# Patient Record
Sex: Male | Born: 1950
Health system: Southern US, Community
[De-identification: ages and names within clinical notes are randomized; demographics above are authoritative.]

## PROBLEM LIST (undated history)

## (undated) DIAGNOSIS — N182 Chronic kidney disease, stage 2 (mild): Secondary | ICD-10-CM

## (undated) DIAGNOSIS — K219 Gastro-esophageal reflux disease without esophagitis: Secondary | ICD-10-CM

## (undated) DIAGNOSIS — T8859XA Other complications of anesthesia, initial encounter: Secondary | ICD-10-CM

## (undated) DIAGNOSIS — R7303 Prediabetes: Secondary | ICD-10-CM

## (undated) DIAGNOSIS — Z79899 Other long term (current) drug therapy: Secondary | ICD-10-CM

## (undated) DIAGNOSIS — I10 Essential (primary) hypertension: Secondary | ICD-10-CM

## (undated) DIAGNOSIS — M1712 Unilateral primary osteoarthritis, left knee: Secondary | ICD-10-CM

## (undated) DIAGNOSIS — E785 Hyperlipidemia, unspecified: Secondary | ICD-10-CM

## (undated) DIAGNOSIS — Z973 Presence of spectacles and contact lenses: Secondary | ICD-10-CM

## (undated) DIAGNOSIS — N400 Enlarged prostate without lower urinary tract symptoms: Secondary | ICD-10-CM

## (undated) HISTORY — DX: Prediabetes: R73.03

## (undated) HISTORY — DX: Hyperlipidemia, unspecified: E78.5

## (undated) HISTORY — DX: Chronic kidney disease, stage 2 (mild): N18.2

## (undated) HISTORY — PX: COLONOSCOPY: SHX174

## (undated) HISTORY — DX: Other long term (current) drug therapy: Z79.899

---

## 1988-10-24 HISTORY — PX: KNEE ARTHROSCOPY: SHX127

## 1990-10-24 HISTORY — PX: INGUINAL HERNIA REPAIR: SUR1180

## 2001-10-24 HISTORY — PX: CARPAL TUNNEL RELEASE: SHX101

## 2002-04-01 ENCOUNTER — Encounter: Admission: RE | Admit: 2002-04-01 | Discharge: 2002-04-01 | Payer: Self-pay | Admitting: Gastroenterology

## 2002-04-01 ENCOUNTER — Encounter: Payer: Self-pay | Admitting: Gastroenterology

## 2002-04-18 ENCOUNTER — Ambulatory Visit (HOSPITAL_COMMUNITY): Admission: RE | Admit: 2002-04-18 | Discharge: 2002-04-18 | Payer: Self-pay | Admitting: Gastroenterology

## 2004-02-20 ENCOUNTER — Ambulatory Visit (HOSPITAL_COMMUNITY): Admission: RE | Admit: 2004-02-20 | Discharge: 2004-02-20 | Payer: Self-pay | Admitting: Gastroenterology

## 2007-12-08 ENCOUNTER — Emergency Department (HOSPITAL_COMMUNITY): Admission: EM | Admit: 2007-12-08 | Discharge: 2007-12-08 | Payer: Self-pay | Admitting: Emergency Medicine

## 2008-07-24 ENCOUNTER — Emergency Department (HOSPITAL_COMMUNITY): Admission: EM | Admit: 2008-07-24 | Discharge: 2008-07-24 | Payer: Self-pay | Admitting: Emergency Medicine

## 2009-04-18 ENCOUNTER — Emergency Department (HOSPITAL_COMMUNITY): Admission: EM | Admit: 2009-04-18 | Discharge: 2009-04-18 | Payer: Self-pay | Admitting: Emergency Medicine

## 2009-04-29 ENCOUNTER — Encounter: Admission: RE | Admit: 2009-04-29 | Discharge: 2009-04-29 | Payer: Self-pay | Admitting: Orthopedic Surgery

## 2011-01-31 LAB — BASIC METABOLIC PANEL
BUN: 11 mg/dL (ref 6–23)
CO2: 32 mEq/L (ref 19–32)
Calcium: 9.1 mg/dL (ref 8.4–10.5)
Chloride: 104 mEq/L (ref 96–112)
Creatinine, Ser: 1.14 mg/dL (ref 0.4–1.5)
GFR calc Af Amer: >60 mL/min (ref >60)
GFR calc non Af Amer: >60 mL/min (ref >60)
Glucose, Bld: 100 mg/dL — ABNORMAL HIGH (ref 70–99)
Potassium: 3.3 mEq/L — ABNORMAL LOW (ref 3.5–5.1)
Sodium: 141 mEq/L (ref 135–145)

## 2011-01-31 LAB — DIFFERENTIAL
Basophils Absolute: 0 10*3/uL (ref 0.0–0.1)
Eosinophils Absolute: 0.2 10*3/uL (ref 0.0–0.7)
Eosinophils Relative: 6 % — ABNORMAL HIGH (ref 0–5)
Lymphocytes Relative: 38 % (ref 12–46)
Monocytes Absolute: 0.5 10*3/uL (ref 0.1–1.0)

## 2011-01-31 LAB — CBC
HCT: 43.9 % (ref 39.0–52.0)
Hemoglobin: 14.8 g/dL (ref 13.0–17.0)
MCHC: 33.8 g/dL (ref 30.0–36.0)
MCV: 89.7 fL (ref 78.0–100.0)
RDW: 13.6 % (ref 11.5–15.5)

## 2011-03-11 NOTE — Op Note (Signed)
NAME:  HASTEN, SWEITZER                         ACCOUNT NO.:  [id number]   MEDICAL RECORD NO.:  [id number]                   PATIENT TYPE:  AMB   LOCATION:  ENDO                                 FACILITY:  [facility]   PHYSICIAN:  Anselmo Rod, M.D.               DATE OF BIRTH:  01/06/1951   DATE OF PROCEDURE:  02/20/2004  DATE OF DISCHARGE:                                 OPERATIVE REPORT   PROCEDURE PERFORMED:  Screening colonoscopy.   ENDOSCOPIST:  Anselmo Rod, M.D.   INSTRUMENT USED:  Olympus video colonoscope.   INDICATION FOR PROCEDURE:  A 60 year old African-American male undergoing a  screening colonoscopy for rectal bleeding.  Rule out colonic polyps, masses,  etc.   PREPROCEDURE PREPARATION:  Informed consent was procured from the patient.  The patient was fasted for eight hours prior to the procedure and prepped  with a bottle of magnesium citrate and a gallon of GoLYTELY the night prior  to the procedure.   PREPROCEDURE PHYSICAL:  VITAL SIGNS:  The patient had stable vital signs.  NECK:  Supple.  CHEST:  Clear to auscultation.  S1, S2 regular.  ABDOMEN:  Soft with normal bowel sounds.   DESCRIPTION OF PROCEDURE:  The patient was placed in the left lateral  decubitus position and sedated with 50 mg of Demerol and 5 mg of Versed  intravenously.  Once the patient was adequately sedate and maintained on low-  flow oxygen and continuous cardiac monitoring, the Olympus video colonoscope  was advanced from the rectum to the cecum.  The appendiceal orifice and the  ileocecal valve were clearly visualized and photographed.  No masses or  polyps were identified.  There was some residual stool in the colon.  Multiple washes were done.  There was no evidence of diverticulosis.  Small  internal hemorrhoids were seen on retroflexion.  I suspect these are the  patient's source of rectal bleeding.  No other source of bleeding could be  identified.  The patient tolerated the  procedure well without immediate  complications.   IMPRESSION:  Normal colonoscopy up to the cecum except for small internal  hemorrhoids.   RECOMMENDATIONS:  1. Repeat CRC screening is recommended in the next 10 years unless the     patient develops any abnormal symptoms in the interim.  2. Continue a high-fiber diet with liberal fluid intake.  3. Outpatient follow-up if rectal bleeding recurs.  Steroid suppositories     versus surgical evaluation will be considered at that time.                                               Anselmo Rod, M.D.    JNM/MEDQ  D:  02/20/2004  T:  02/20/2004  Job:  [id number]   cc:  Olene Craven, M.D.  [address]  [address]  [place]  [place] [address]  Fax: [phone]

## 2011-03-11 NOTE — Procedures (Signed)
[place]  Patient:    Francisco Simmons, Francisco Simmons. Visit Number: [id number] MRN: [id number]          Service Type: Attending:  Anselmo Rod, M.D. Dictated by:   Anselmo Rod, M.D. Proc. Date: 04/18/02   CC:         Kern Reap, M.D.   Procedure Report  DATE OF BIRTH:  [birth year]  PROCEDURE PERFORMED:  Esophagogastroduodenoscopy with Savary dilatation of distal esophageal narrowing.  ENDOSCOPIST:  Anselmo Rod, M.D.  INSTRUMENT USED:  Olympus video panendoscope.  INDICATIONS FOR PROCEDURE:  A 60 year old African American male with a history of dysphagia for solids, rule out esophageal stricture.  The patient had a barium swallow that showed no evidence of a definite stricture, but slight narrowing of the distal esophagus.  PREPROCEDURE PREPARATION:  Informed consent was procured from the patient. The patient was fasted for eight hours prior to the procedure.  PREPROCEDURE PHYSICAL:  VITAL SIGNS:  Stable.  NECK:  Supple.  CHEST:  Clear to auscultation, S1 and S2 regular.  ABDOMEN:  Soft with normal bowel sounds.  DESCRIPTION OF PROCEDURE:  The patient was placed in the left lateral decubitus position and sedated with 50 mg of Demerol and 6 mg of Versed intravenously.  Once the patient was adequate sedated, maintained on low flow oxygen and continuous cardiac monitoring, the Olympus video panendoscope was advanced through the mouth piece over the tongue into the esophagus under direct vision.  The esophagus appeared widely patent and without lesions, and advancing the scope in the stomach.  There was a small hiatal hernia seen on high retroflexion.  Mild antral gastritis was noted.  The proximal small bowel appeared normal.  As the patient had had recent episodes of dysphagia with chicken and meats, a guidewire was passed into the stomach, and the esophagus was dilated with size 14 and size 16 Savary dilators.  There was no  evidence of heme on the dilators when they were pulled back.  Repeat EGD was done.  The GE junction is widely patent.  IMPRESSION: 1. Distal esophagus is dilated with Savary dilators. 2. No definite stricture seen. 3. Antral gastritis. 4. Small hiatal hernia.  RECOMMENDATIONS: 1. Start Protonix 40 mg one p.o. q.d. 2. Outpatient follow up in the next two weeks. 3. Chew foods well and supplement meals with liberal fluids. Dictated by:   Anselmo Rod, M.D. Attending:  Anselmo Rod, M.D. DD:  04/18/02 TD:  04/20/02 Job: [id number]

## 2011-07-15 LAB — BASIC METABOLIC PANEL
BUN: 11
GFR calc non Af Amer: >60
Glucose, Bld: 138 — ABNORMAL HIGH
Potassium: 3.4 — ABNORMAL LOW

## 2011-07-15 LAB — DIFFERENTIAL
Basophils Absolute: 0
Eosinophils Absolute: 0
Eosinophils Relative: 0
Lymphocytes Relative: 9 — ABNORMAL LOW

## 2011-07-15 LAB — CBC
HCT: 44.1
MCV: 87.6
Platelets: 253
RDW: 13.5

## 2011-07-15 LAB — POCT CARDIAC MARKERS
CKMB, poc: 1.8
Myoglobin, poc: 99.1

## 2011-07-15 LAB — RAPID URINE DRUG SCREEN, HOSP PERFORMED: Barbiturates: NOT DETECTED

## 2014-04-11 ENCOUNTER — Other Ambulatory Visit: Payer: Self-pay | Admitting: Orthopedic Surgery

## 2014-04-14 ENCOUNTER — Encounter (HOSPITAL_BASED_OUTPATIENT_CLINIC_OR_DEPARTMENT_OTHER)
Admission: RE | Admit: 2014-04-14 | Discharge: 2014-04-14 | Disposition: A | Discharge status: Home / Self Care | Payer: Worker's Compensation | Source: Ambulatory Visit | Attending: Orthopedic Surgery | Admitting: Orthopedic Surgery

## 2014-04-14 ENCOUNTER — Encounter (HOSPITAL_BASED_OUTPATIENT_CLINIC_OR_DEPARTMENT_OTHER): Payer: Self-pay | Admitting: *Deleted

## 2014-04-14 DIAGNOSIS — Z01810 Encounter for preprocedural cardiovascular examination: Secondary | ICD-10-CM | POA: Insufficient documentation

## 2014-04-14 LAB — BASIC METABOLIC PANEL
BUN: 14 mg/dL (ref 6–23)
CALCIUM: 8.5 mg/dL (ref 8.4–10.5)
CHLORIDE: 103 meq/L (ref 96–112)
CO2: 25 meq/L (ref 19–32)
CREATININE: 1.3 mg/dL (ref 0.50–1.35)
GFR calc Af Amer: 66 mL/min — ABNORMAL LOW (ref >90)
GFR calc non Af Amer: 57 mL/min — ABNORMAL LOW (ref >90)
GLUCOSE: 94 mg/dL (ref 70–99)
Potassium: 3.8 mEq/L (ref 3.7–5.3)
Sodium: 140 mEq/L (ref 137–147)

## 2014-04-14 NOTE — Progress Notes (Signed)
willlcome in for bmet-ekg

## 2014-04-14 NOTE — Progress Notes (Signed)
EKG reviewed by Dr Hurman Hornssesy. Please obtain records from primary/cardiology Dr Sharyn LullHarwani. Office notified by phone call and fax request.

## 2014-04-14 NOTE — Progress Notes (Signed)
04/14/14 1031  OBSTRUCTIVE SLEEP APNEA  Have you ever been diagnosed with sleep apnea through a sleep study? No  Do you snore loudly (loud enough to be heard through closed doors)?  0  Do you often feel tired, fatigued, or sleepy during the daytime? 0  Has anyone observed you stop breathing during your sleep? 0  Do you have, or are you being treated for high blood pressure? 1  BMI more than 35 kg/m2? 0  Age over 64 years old? 1  Neck circumference greater than 40 cm/16 inches? 1  Gender: 1  Obstructive Sleep Apnea Score 4  Score 4 or greater  Results sent to PCP

## 2014-04-14 NOTE — H&P (Signed)
Francisco Simmons. is an 63 y.o. male.   Chief Complaint: chronic left hand numbness HPI: 63 year old male with chronic left hand numbness.  Positive NCV by Dr Catalina Gravel on 03/21/2014  Past Medical History  Diagnosis Date  . Hypertension   . Wears glasses   . BPH (benign prostatic hypertrophy)     Past Surgical History  Procedure Laterality Date  . Carpal tunnel release  2003    right  . Inguinal hernia repair  1992    left  . Knee arthroscopy  1990    right and left  . Colonoscopy      History reviewed. No pertinent family history. Social History:  reports that he quit smoking about 30 years ago. He does not have any smokeless tobacco history on file. He reports that he does not drink alcohol. His drug history is not on file.  Allergies: No Known Allergies  No prescriptions prior to admission    Results for orders placed during the hospital encounter of 04/15/14 (from the past 48 hour(s))  BASIC METABOLIC PANEL     Status: Abnormal   Collection Time    04/14/14  4:00 PM      Result Value Ref Range   Sodium 140  137 - 147 mEq/L   Potassium 3.8  3.7 - 5.3 mEq/L   Chloride 103  96 - 112 mEq/L   CO2 25  19 - 32 mEq/L   Glucose, Bld 94  70 - 99 mg/dL   BUN 14  6 - 23 mg/dL   Creatinine, Ser 1.30  0.50 - 1.35 mg/dL   Calcium 8.5  8.4 - 10.5 mg/dL   GFR calc non Af Amer 57 (*) >90 mL/min   GFR calc Af Amer 66 (*) >90 mL/min   Comment: (NOTE)     The eGFR has been calculated using the CKD EPI equation.     This calculation has not been validated in all clinical situations.     eGFR's persistently <90 mL/min signify possible Chronic Kidney     Disease.   No results found.  Review of Systems  Constitutional: Negative.   HENT: Negative.   Eyes:       Glasses  Respiratory: Negative.   Cardiovascular:       High blood pressure  Gastrointestinal: Negative.   Genitourinary: Negative.   Musculoskeletal:       Left carpal tunnel syndrome  Skin: Negative.   Neurological:  Positive for tingling and sensory change.  Endo/Heme/Allergies: Negative.   Psychiatric/Behavioral: Negative.     Height 6' 2"  (1.88 m), weight 90.719 kg (200 lb). Physical Exam  Constitutional: He is oriented to person, place, and time. He appears well-developed and well-nourished.  HENT:  Head: Normocephalic and atraumatic.  Eyes: Conjunctivae and EOM are normal. Pupils are equal, round, and reactive to light.  Neck: Normal range of motion.  Cardiovascular: Normal rate, regular rhythm, normal heart sounds and intact distal pulses.   Respiratory: Effort normal and breath sounds normal.  GI: Soft. Bowel sounds are normal.  Musculoskeletal: Normal range of motion.  Decreased sensation of median fingers of left hand. No thenar atrophy of left hand.  Neurological: He is alert and oriented to person, place, and time.  Skin: Skin is warm and dry.  Psychiatric: He has a normal mood and affect. His behavior is normal. Thought content normal.     Assessment/Plan:  Left carpal tunnel syndrome  Left carpal tunnel release.  SYPHER JR,ROBERT V  04/14/2014, 10:38 PM

## 2014-04-15 ENCOUNTER — Ambulatory Visit (HOSPITAL_BASED_OUTPATIENT_CLINIC_OR_DEPARTMENT_OTHER): Payer: Worker's Compensation | Admitting: Anesthesiology

## 2014-04-15 ENCOUNTER — Encounter (HOSPITAL_BASED_OUTPATIENT_CLINIC_OR_DEPARTMENT_OTHER)
Admission: RE | Disposition: A | Discharge status: Home / Self Care | Payer: Self-pay | Source: Ambulatory Visit | Attending: Orthopedic Surgery

## 2014-04-15 ENCOUNTER — Encounter (HOSPITAL_BASED_OUTPATIENT_CLINIC_OR_DEPARTMENT_OTHER): Payer: Self-pay | Admitting: Anesthesiology

## 2014-04-15 ENCOUNTER — Encounter (HOSPITAL_BASED_OUTPATIENT_CLINIC_OR_DEPARTMENT_OTHER): Payer: Worker's Compensation | Admitting: Anesthesiology

## 2014-04-15 ENCOUNTER — Ambulatory Visit (HOSPITAL_BASED_OUTPATIENT_CLINIC_OR_DEPARTMENT_OTHER)
Admission: RE | Admit: 2014-04-15 | Discharge: 2014-04-15 | Disposition: A | Discharge status: Home / Self Care | Payer: Worker's Compensation | Source: Ambulatory Visit | Attending: Orthopedic Surgery | Admitting: Orthopedic Surgery

## 2014-04-15 DIAGNOSIS — G5600 Carpal tunnel syndrome, unspecified upper limb: Secondary | ICD-10-CM | POA: Insufficient documentation

## 2014-04-15 DIAGNOSIS — I10 Essential (primary) hypertension: Secondary | ICD-10-CM | POA: Insufficient documentation

## 2014-04-15 DIAGNOSIS — Z87891 Personal history of nicotine dependence: Secondary | ICD-10-CM | POA: Insufficient documentation

## 2014-04-15 DIAGNOSIS — Z01812 Encounter for preprocedural laboratory examination: Secondary | ICD-10-CM | POA: Insufficient documentation

## 2014-04-15 DIAGNOSIS — N400 Enlarged prostate without lower urinary tract symptoms: Secondary | ICD-10-CM | POA: Insufficient documentation

## 2014-04-15 HISTORY — DX: Presence of spectacles and contact lenses: Z97.3

## 2014-04-15 HISTORY — DX: Benign prostatic hyperplasia without lower urinary tract symptoms: N40.0

## 2014-04-15 HISTORY — DX: Essential (primary) hypertension: I10

## 2014-04-15 HISTORY — PX: CARPAL TUNNEL RELEASE: SHX101

## 2014-04-15 SURGERY — CARPAL TUNNEL RELEASE
Anesthesia: General | Laterality: Left

## 2014-04-15 MED ORDER — PROPOFOL 10 MG/ML IV BOLUS
INTRAVENOUS | Status: DC | PRN
  Administered 2014-04-15: 200 mg via INTRAVENOUS

## 2014-04-15 MED ORDER — LACTATED RINGERS IV SOLN
INTRAVENOUS | Status: DC
  Administered 2014-04-15: 07:00:00 via INTRAVENOUS

## 2014-04-15 MED ORDER — DEXAMETHASONE SODIUM PHOSPHATE 10 MG/ML IJ SOLN
INTRAMUSCULAR | Status: DC | PRN
  Administered 2014-04-15: 10 mg via INTRAVENOUS

## 2014-04-15 MED ORDER — HYDROMORPHONE HCL PF 1 MG/ML IJ SOLN
0.2500 mg | INTRAMUSCULAR | Status: DC | PRN
Start: 2014-04-15 — End: 2014-04-15

## 2014-04-15 MED ORDER — LIDOCAINE HCL 2 % IJ SOLN
INTRAMUSCULAR | Status: DC | PRN
  Administered 2014-04-15: 5 mL

## 2014-04-15 MED ORDER — LIDOCAINE HCL 2 % IJ SOLN
INTRAMUSCULAR | Status: AC
  Filled 2014-04-15: qty 20

## 2014-04-15 MED ORDER — MIDAZOLAM HCL 2 MG/2ML IJ SOLN
INTRAMUSCULAR | Status: AC
  Filled 2014-04-15: qty 2

## 2014-04-15 MED ORDER — LIDOCAINE HCL (CARDIAC) 20 MG/ML IV SOLN
INTRAVENOUS | Status: DC | PRN
  Administered 2014-04-15: 60 mg via INTRAVENOUS

## 2014-04-15 MED ORDER — CHLORHEXIDINE GLUCONATE 4 % EX LIQD: 60.0000 mL | Freq: Once | CUTANEOUS | Status: DC

## 2014-04-15 MED ORDER — FENTANYL CITRATE 0.05 MG/ML IJ SOLN
INTRAMUSCULAR | Status: AC
  Filled 2014-04-15: qty 4

## 2014-04-15 MED ORDER — FENTANYL CITRATE 0.05 MG/ML IJ SOLN
INTRAMUSCULAR | Status: DC | PRN
  Administered 2014-04-15: 100 ug via INTRAVENOUS

## 2014-04-15 MED ORDER — OXYCODONE HCL 5 MG PO TABS: 5.0000 mg | ORAL_TABLET | Freq: Once | ORAL | Status: DC | PRN

## 2014-04-15 MED ORDER — FENTANYL CITRATE 0.05 MG/ML IJ SOLN: 50.0000 ug | INTRAMUSCULAR | Status: DC | PRN

## 2014-04-15 MED ORDER — MIDAZOLAM HCL 5 MG/5ML IJ SOLN
INTRAMUSCULAR | Status: DC | PRN
  Administered 2014-04-15: 2 mg via INTRAVENOUS

## 2014-04-15 MED ORDER — OXYCODONE HCL 5 MG/5ML PO SOLN: 5.0000 mg | Freq: Once | ORAL | Status: DC | PRN

## 2014-04-15 MED ORDER — OXYCODONE-ACETAMINOPHEN 5-325 MG PO TABS: ORAL_TABLET | ORAL | Status: DC

## 2014-04-15 MED ORDER — MIDAZOLAM HCL 2 MG/2ML IJ SOLN: 1.0000 mg | INTRAMUSCULAR | Status: DC | PRN

## 2014-04-15 MED ORDER — PROPOFOL 10 MG/ML IV BOLUS
INTRAVENOUS | Status: AC
  Filled 2014-04-15: qty 20

## 2014-04-15 SURGICAL SUPPLY — 39 items
BANDAGE ADH SHEER 1  50/CT (GAUZE/BANDAGES/DRESSINGS) IMPLANT
BANDAGE COBAN STERILE 2 (GAUZE/BANDAGES/DRESSINGS) IMPLANT
BANDAGE ELASTIC 3 VELCRO ST LF (GAUZE/BANDAGES/DRESSINGS) IMPLANT
BLADE SURG 15 STRL LF DISP TIS (BLADE) ×1 IMPLANT
BLADE SURG 15 STRL SS (BLADE) ×2
BNDG ESMARK 4X9 LF (GAUZE/BANDAGES/DRESSINGS) ×3 IMPLANT
BRUSH SCRUB EZ PLAIN DRY (MISCELLANEOUS) ×3 IMPLANT
CLOSURE WOUND 1/2 X4 (GAUZE/BANDAGES/DRESSINGS) ×1
CORDS BIPOLAR (ELECTRODE) ×3 IMPLANT
COVER MAYO STAND STRL (DRAPES) ×3 IMPLANT
COVER TABLE BACK 60X90 (DRAPES) ×3 IMPLANT
CUFF TOURNIQUET SINGLE 18IN (TOURNIQUET CUFF) ×3 IMPLANT
DECANTER SPIKE VIAL GLASS SM (MISCELLANEOUS) IMPLANT
DRAPE EXTREMITY T 121X128X90 (DRAPE) ×3 IMPLANT
DRAPE SURG 17X23 STRL (DRAPES) ×3 IMPLANT
GAUZE SPONGE 4X4 12PLY STRL (GAUZE/BANDAGES/DRESSINGS) ×3 IMPLANT
GLOVE BIO SURGEON STRL SZ7 (GLOVE) ×6 IMPLANT
GLOVE BIOGEL M STRL SZ7.5 (GLOVE) IMPLANT
GLOVE ORTHO TXT STRL SZ7.5 (GLOVE) ×3 IMPLANT
GOWN STRL REUS W/ TWL LRG LVL3 (GOWN DISPOSABLE) ×1 IMPLANT
GOWN STRL REUS W/ TWL XL LVL3 (GOWN DISPOSABLE) ×1 IMPLANT
GOWN STRL REUS W/TWL LRG LVL3 (GOWN DISPOSABLE) ×2
GOWN STRL REUS W/TWL XL LVL3 (GOWN DISPOSABLE) ×2
NEEDLE 27GAX1X1/2 (NEEDLE) ×3 IMPLANT
PACK BASIN DAY SURGERY FS (CUSTOM PROCEDURE TRAY) ×3 IMPLANT
PAD CAST 3X4 CTTN HI CHSV (CAST SUPPLIES) ×1 IMPLANT
PADDING CAST ABS 4INX4YD NS (CAST SUPPLIES)
PADDING CAST ABS COTTON 4X4 ST (CAST SUPPLIES) IMPLANT
PADDING CAST COTTON 3X4 STRL (CAST SUPPLIES) ×2
SPLINT PLASTER CAST XFAST 3X15 (CAST SUPPLIES) ×5 IMPLANT
SPLINT PLASTER XTRA FASTSET 3X (CAST SUPPLIES) ×10
STOCKINETTE 4X48 STRL (DRAPES) ×3 IMPLANT
STRIP CLOSURE SKIN 1/2X4 (GAUZE/BANDAGES/DRESSINGS) ×2 IMPLANT
SUT PROLENE 3 0 PS 2 (SUTURE) ×3 IMPLANT
SYR 3ML 23GX1 SAFETY (SYRINGE) IMPLANT
SYR CONTROL 10ML LL (SYRINGE) ×3 IMPLANT
TOWEL OR 17X24 6PK STRL BLUE (TOWEL DISPOSABLE) ×3 IMPLANT
TRAY DSU PREP LF (CUSTOM PROCEDURE TRAY) ×3 IMPLANT
UNDERPAD 30X30 INCONTINENT (UNDERPADS AND DIAPERS) ×3 IMPLANT

## 2014-04-15 NOTE — Anesthesia Procedure Notes (Signed)
Procedure Name: LMA Insertion Date/Time: 04/15/2014 7:39 AM Performed by: Burna CashONRAD, JOSEPH C Pre-anesthesia Checklist: Patient identified, Emergency Drugs available, Suction available and Patient being monitored Patient Re-evaluated:Patient Re-evaluated prior to inductionOxygen Delivery Method: Circle System Utilized Preoxygenation: Pre-oxygenation with 100% oxygen Intubation Type: IV induction Ventilation: Mask ventilation without difficulty LMA: LMA inserted LMA Size: 5.0 Number of attempts: 1 Airway Equipment and Method: bite block Placement Confirmation: positive ETCO2 Tube secured with: Tape Dental Injury: Teeth and Oropharynx as per pre-operative assessment

## 2014-04-15 NOTE — Anesthesia Postprocedure Evaluation (Signed)
  Anesthesia Post-op Note  Patient: Francisco HarderHarold C Haigler Jr.  Procedure(s) Performed: Procedure(s): LEFT CARPAL TUNNEL RELEASE (Left)  Patient Location: PACU  Anesthesia Type:General  Level of Consciousness: awake and alert   Airway and Oxygen Therapy: Patient Spontanous Breathing  Post-op Pain: none  Post-op Assessment: Post-op Vital signs reviewed, Patient's Cardiovascular Status Stable and Respiratory Function Stable  Post-op Vital Signs: Reviewed  Filed Vitals:   04/15/14 0845  BP: 124/83  Pulse: 53  Temp:   Resp: 23    Complications: No apparent anesthesia complications

## 2014-04-15 NOTE — Op Note (Signed)
[name], [name]               ACCOUNT NO.:  [id number]  MEDICAL RECORD NO.:  [id number]  LOCATION:                                 FACILITY:  PHYSICIAN:  Katy Fitchobert V. Tarae Wooden, M.D.      DATE OF BIRTH:  DATE OF PROCEDURE:  04/15/2014 DATE OF DISCHARGE:                              OPERATIVE REPORT   PREOPERATIVE DIAGNOSIS:  Significant left carpal tunnel syndrome with electrodiagnostic studies completed by Dr. 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  POSTOPERATIVE DIAGNOSIS:  Significant left carpal tunnel syndrome with electrodiagnostic studies completed by Dr. 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  OPERATION:  Release of left transverse carpal ligament and complex mid palmar dissection ruling out atypical median ulnar anastomosis and/or atypical transligamentous motor branch.  OPERATING SURGEON:  Katy Fitchobert V. Aadya Kindler, MD  ASSISTANT:  Nurse.  ANESTHESIA:  General by LMA.  SUPERVISING ANESTHESIOLOGIST:  Zenon MayoW. Edmond Fitzgerald, MD.  INDICATIONS:  Francisco Simmons is a 63 year old gentleman who was referred for evaluation and management of left hand numbness.  He had a prior right carpal tunnel release and Camitz transfer due to severe thenar atrophy.  Clinical examination suggested signs of significant carpal tunnel syndrome.  He was referred to see Dr. Clarisse GougeLewit for electrodiagnostic studies.  These were completed revealing significant left carpal tunnel syndrome.  We advised Francisco Simmons to undergo release of his left transverse carpal ligament.  The surgery and aftercare were detailed.  Questions were invited and answered.  Preoperatively, Francisco Simmons was noted to have no drug allergies.  PROCEDURE:  Francisco Simmons was interviewed in the holding area, and his left hand and wrist marked as a proper surgical  site per protocol with a marking pen.  He was [facility] where under Dr. Jarrett AblesFitzgerald's direct supervision general anesthesia by LMA technique was induced.  The left hand and arm were then prepped with Betadine soap and solution and sterilely draped.  A pneumatic tourniquet was applied to the proximal left brachium.  Following exsanguination of the left arm with Esmarch bandage, the arterial tourniquet was inflated to 220 mmHg.  Following routine surgical time-out, the procedure commenced with a short incision in the line of the ring finger in the proximal palm. Subcutaneous tissues were carefully divided revealing multiple subcutaneous veins.  These were electrocauterized with bipolar current. The distal margin of the transverse carpal ligament was obscured by a very large mid palmar muscle.  This is an unnamed muscle.  We carefully teased through the fibers of this muscle looking for signs of a median to ulnar nerve anastomosis that could lead to thenar or hypothenar muscle dysfunction.  We were also extremely concerned about a transligamentous motor branch.  After teasing apart the muscle fibers meticulously, we identified the distal margin of the transverse carpal ligament and performed a gradual proximal release of the ligament with scissors.  We extended the release to the ulnar aspect of the transverse carpal ligament to the distal forearm.  We then released the volar forearm fascia subcutaneously  with scissors.  This widely opened the carpal canal.  The ulnar bursa was thickened and fibrotic.  No masses or other predicaments were noted.  We did not identify a transligamentous motor branch and I did not identify a median ulnar nerve branch.  The wound was inspected for bleeding points followed by repair of the skin with segmental intradermal 3-0 Prolene.  Steri-Strip was applied.  A compressive dressing was applied with a volar plaster  splint maintaining the wrist at 15 degrees dorsiflexion.  For aftercare, Francisco Simmons was provided a prescription for Percocet 5 mg 1 p.o. q.4-6 hours p.r.n. pain, 20 tablets without refill.     Katy Fitchobert V. Donnie Panik, M.D.     RVS/MEDQ  D:  04/15/2014  T:  04/15/2014  Job:  [id number]

## 2014-04-15 NOTE — Brief Op Note (Signed)
04/15/2014  8:12 AM  PATIENT:  Francisco HarderHarold C Paci Jr.  63 y.o. male  PRE-OPERATIVE DIAGNOSIS:  LEFT CARPAL TUNNEL SYNDROME  POST-OPERATIVE DIAGNOSIS:  LEFT CARPAL TUNNEL SYNDROME  PROCEDURE:  Procedure(s): LEFT CARPAL TUNNEL RELEASE (Left)  SURGEON:  Surgeon(s) and Role:    * Wyn Forsterobert Avy Barlett Jr V, MD - Primary  PHYSICIAN ASSISTANT:   ASSISTANTS: nurse   ANESTHESIA:   general  EBL:  Total I/O In: 200 [I.V.:200] Out: -   BLOOD ADMINISTERED:none  DRAINS: none   LOCAL MEDICATIONS USED:  LIDOCAINE   SPECIMEN:  No Specimen  DISPOSITION OF SPECIMEN:  N/A  COUNTS:  YES  TOURNIQUET:   Total Tourniquet Time Documented: Upper Arm (Left) - 20 minutes Total: Upper Arm (Left) - 20 minutes   DICTATION: .Other Dictation: Dictation Number [id number]  PLAN OF CARE: Discharge to home after PACU  PATIENT DISPOSITION:  PACU - hemodynamically stable.   Delay start of Pharmacological VTE agent (>24hrs) due to surgical blood loss or risk of bleeding: not applicable

## 2014-04-15 NOTE — Transfer of Care (Signed)
Immediate Anesthesia Transfer of Care Note  Patient: Francisco HarderHarold C Doleman Jr.  Procedure(s) Performed: Procedure(s): LEFT CARPAL TUNNEL RELEASE (Left)  Patient Location: PACU  Anesthesia Type:General  Level of Consciousness: sedated  Airway & Oxygen Therapy: Patient Spontanous Breathing and Patient connected to face mask oxygen  Post-op Assessment: Report given to PACU RN and Post -op Vital signs reviewed and stable  Post vital signs: Reviewed and stable  Complications: No apparent anesthesia complications

## 2014-04-15 NOTE — Discharge Instructions (Addendum)
Keep dressing dry.  Use left hand gently Call for fever, dressing problems or un expected difficulties  [phone]   Post Anesthesia Home Care Instructions  Activity: Get plenty of rest for the remainder of the day. A responsible adult should stay with you for 24 hours following the procedure.  For the next 24 hours, DO NOT: -Drive a car -Advertising copywriterperate machinery -Drink alcoholic beverages -Take any medication unless instructed by your physician -Make any legal decisions or sign important papers.  Meals: Start with liquid foods such as gelatin or soup. Progress to regular foods as tolerated. Avoid greasy, spicy, heavy foods. If nausea and/or vomiting occur, drink only clear liquids until the nausea and/or vomiting subsides. Call your physician if vomiting continues.  Special Instructions/Symptoms: Your throat may feel dry or sore from the anesthesia or the breathing tube placed in your throat during surgery. If this causes discomfort, gargle with warm salt water. The discomfort should disappear within 24 hours.

## 2014-04-15 NOTE — Op Note (Signed)
[id number] 

## 2014-04-15 NOTE — Anesthesia Preprocedure Evaluation (Addendum)
Anesthesia Evaluation  Patient identified by MRN, date of birth, ID band Patient awake    Reviewed: Allergy & Precautions, H&P , NPO status , Patient's Chart, lab work & pertinent test results  History of Anesthesia Complications Negative for: history of anesthetic complications  Airway Mallampati: II TM Distance: >3 FB Neck ROM: Full    Dental no notable dental hx. (+) Teeth Intact, Dental Advisory Given   Pulmonary neg pulmonary ROS, former smoker,  breath sounds clear to auscultation  Pulmonary exam normal       Cardiovascular hypertension, Pt. on medications Rhythm:Regular Rate:Normal     Neuro/Psych negative neurological ROS  negative psych ROS   GI/Hepatic negative GI ROS, Neg liver ROS,   Endo/Other  negative endocrine ROS  Renal/GU negative Renal ROS  negative genitourinary   Musculoskeletal   Abdominal   Peds  Hematology negative hematology ROS (+)   Anesthesia Other Findings   Reproductive/Obstetrics negative OB ROS                          Anesthesia Physical Anesthesia Plan  ASA: II  Anesthesia Plan: General   Post-op Pain Management:    Induction: Intravenous  Airway Management Planned: LMA  Additional Equipment:   Intra-op Plan:   Post-operative Plan: Extubation in OR  Informed Consent: I have reviewed the patients History and Physical, chart, labs and discussed the procedure including the risks, benefits and alternatives for the proposed anesthesia with the patient or authorized representative who has indicated his/her understanding and acceptance.   Dental advisory given  Plan Discussed with: CRNA  Anesthesia Plan Comments:         Anesthesia Quick Evaluation

## 2014-04-16 ENCOUNTER — Encounter (HOSPITAL_BASED_OUTPATIENT_CLINIC_OR_DEPARTMENT_OTHER): Payer: Self-pay | Admitting: Orthopedic Surgery

## 2014-09-11 ENCOUNTER — Emergency Department (HOSPITAL_COMMUNITY)
Admission: EM | Admit: 2014-09-11 | Discharge: 2014-09-11 | Disposition: A | Discharge status: Home / Self Care | Payer: BC Managed Care – PPO | Attending: Emergency Medicine | Admitting: Emergency Medicine

## 2014-09-11 ENCOUNTER — Encounter (HOSPITAL_COMMUNITY): Payer: Self-pay | Admitting: *Deleted

## 2014-09-11 ENCOUNTER — Emergency Department (HOSPITAL_COMMUNITY): Payer: BC Managed Care – PPO

## 2014-09-11 DIAGNOSIS — Z87438 Personal history of other diseases of male genital organs: Secondary | ICD-10-CM | POA: Insufficient documentation

## 2014-09-11 DIAGNOSIS — Z87891 Personal history of nicotine dependence: Secondary | ICD-10-CM | POA: Insufficient documentation

## 2014-09-11 DIAGNOSIS — Z79899 Other long term (current) drug therapy: Secondary | ICD-10-CM | POA: Insufficient documentation

## 2014-09-11 DIAGNOSIS — I10 Essential (primary) hypertension: Secondary | ICD-10-CM | POA: Diagnosis not present

## 2014-09-11 DIAGNOSIS — Z7982 Long term (current) use of aspirin: Secondary | ICD-10-CM | POA: Insufficient documentation

## 2014-09-11 DIAGNOSIS — R42 Dizziness and giddiness: Secondary | ICD-10-CM | POA: Insufficient documentation

## 2014-09-11 LAB — CBC
HCT: 41.6 % (ref 39.0–52.0)
HEMOGLOBIN: 14.4 g/dL (ref 13.0–17.0)
MCH: 30.3 pg (ref 26.0–34.0)
MCHC: 34.6 g/dL (ref 30.0–36.0)
MCV: 87.6 fL (ref 78.0–100.0)
Platelets: 210 10*3/uL (ref 150–400)
RBC: 4.75 MIL/uL (ref 4.22–5.81)
RDW: 12.6 % (ref 11.5–15.5)
WBC: 6.6 10*3/uL (ref 4.0–10.5)

## 2014-09-11 LAB — DIFFERENTIAL
Basophils Absolute: 0 10*3/uL (ref 0.0–0.1)
Basophils Relative: 1 % (ref 0–1)
EOS ABS: 0 10*3/uL (ref 0.0–0.7)
EOS PCT: 0 % (ref 0–5)
Lymphocytes Relative: 10 % — ABNORMAL LOW (ref 12–46)
Lymphs Abs: 0.7 10*3/uL (ref 0.7–4.0)
MONOS PCT: 4 % (ref 3–12)
Monocytes Absolute: 0.3 10*3/uL (ref 0.1–1.0)
NEUTROS PCT: 85 % — AB (ref 43–77)
Neutro Abs: 5.6 10*3/uL (ref 1.7–7.7)

## 2014-09-11 LAB — COMPREHENSIVE METABOLIC PANEL
ALBUMIN: 3.7 g/dL (ref 3.5–5.2)
ALT: 13 U/L (ref 0–53)
ANION GAP: 15 (ref 5–15)
AST: 18 U/L (ref 0–37)
Alkaline Phosphatase: 68 U/L (ref 39–117)
BUN: 11 mg/dL (ref 6–23)
CALCIUM: 8.7 mg/dL (ref 8.4–10.5)
CO2: 24 mEq/L (ref 19–32)
CREATININE: 1.16 mg/dL (ref 0.50–1.35)
Chloride: 104 mEq/L (ref 96–112)
GFR calc non Af Amer: 65 mL/min — ABNORMAL LOW (ref >90)
GFR, EST AFRICAN AMERICAN: 75 mL/min — AB (ref >90)
GLUCOSE: 178 mg/dL — AB (ref 70–99)
Potassium: 3.3 mEq/L — ABNORMAL LOW (ref 3.7–5.3)
Sodium: 143 mEq/L (ref 137–147)
TOTAL PROTEIN: 7.1 g/dL (ref 6.0–8.3)
Total Bilirubin: 0.4 mg/dL (ref 0.3–1.2)

## 2014-09-11 LAB — APTT: aPTT: 27 seconds (ref 24–37)

## 2014-09-11 LAB — I-STAT TROPONIN, ED: TROPONIN I, POC: 0 ng/mL (ref 0.00–0.08)

## 2014-09-11 LAB — PROTIME-INR
INR: 1.08 (ref 0.00–1.49)
PROTHROMBIN TIME: 14.1 s (ref 11.6–15.2)

## 2014-09-11 LAB — CBG MONITORING, ED: Glucose-Capillary: 168 mg/dL — ABNORMAL HIGH (ref 70–99)

## 2014-09-11 MED ORDER — POTASSIUM CHLORIDE CRYS ER 20 MEQ PO TBCR
40.0000 meq | EXTENDED_RELEASE_TABLET | Freq: Once | ORAL | Status: AC
  Administered 2014-09-11: 40 meq via ORAL
  Filled 2014-09-11: qty 2

## 2014-09-11 MED ORDER — MECLIZINE HCL 12.5 MG PO TABS: 12.5000 mg | ORAL_TABLET | Freq: Three times a day (TID) | ORAL | Status: DC | PRN

## 2014-09-11 MED ORDER — HYDROMORPHONE HCL 1 MG/ML IJ SOLN: 1.0000 mg | Freq: Once | INTRAMUSCULAR | Status: DC

## 2014-09-11 MED ORDER — ONDANSETRON HCL 4 MG/2ML IJ SOLN
4.0000 mg | Freq: Once | INTRAMUSCULAR | Status: AC
  Administered 2014-09-11: 4 mg via INTRAVENOUS
  Filled 2014-09-11: qty 2

## 2014-09-11 MED ORDER — MECLIZINE HCL 25 MG PO TABS: 25.0000 mg | ORAL_TABLET | Freq: Three times a day (TID) | ORAL | Status: DC | PRN

## 2014-09-11 MED ORDER — ONDANSETRON HCL 4 MG/2ML IJ SOLN: 4.0000 mg | Freq: Once | INTRAMUSCULAR | Status: DC

## 2014-09-11 MED ORDER — SODIUM CHLORIDE 0.9 % IV BOLUS (SEPSIS)
1000.0000 mL | Freq: Once | INTRAVENOUS | Status: AC
  Administered 2014-09-11: 1000 mL via INTRAVENOUS

## 2014-09-11 MED ORDER — MECLIZINE HCL 25 MG PO TABS
25.0000 mg | ORAL_TABLET | Freq: Once | ORAL | Status: AC
  Administered 2014-09-11: 25 mg via ORAL
  Filled 2014-09-11: qty 1

## 2014-09-11 NOTE — ED Provider Notes (Signed)
Physical Exam  BP 144/86 mmHg  Pulse 68  Temp(Src) 97.8 F (36.6 C) (Oral)  Resp 18  Ht 6\' 2"  (1.88 m)  Wt 200 lb (90.719 kg)  BMI 25.67 kg/m2  SpO2 98%  Physical Exam  ED Course  Procedures  MDM Care assumed at sign out from Dr. Hyacinth MeekerMiller. Patient here with vertigo. Sign out pending MRI. MRI showed no stroke. On repeat neuro exam, no nystagmus. Nl gait. Will d/c home with meclizine.   Results for orders placed or performed during the hospital encounter of 09/11/14  Protime-INR  Result Value Ref Range   Prothrombin Time 14.1 11.6 - 15.2 seconds   INR 1.08 0.00 - 1.49  APTT  Result Value Ref Range   aPTT 27 24 - 37 seconds  CBC  Result Value Ref Range   WBC 6.6 4.0 - 10.5 K/uL   RBC 4.75 4.22 - 5.81 MIL/uL   Hemoglobin 14.4 13.0 - 17.0 g/dL   HCT 16.141.6 09.639.0 - 04.552.0 %   MCV 87.6 78.0 - 100.0 fL   MCH 30.3 26.0 - 34.0 pg   MCHC 34.6 30.0 - 36.0 g/dL   RDW 40.912.6 81.111.5 - 91.415.5 %   Platelets 210 150 - 400 K/uL  Differential  Result Value Ref Range   Neutrophils Relative % 85 (H) 43 - 77 %   Neutro Abs 5.6 1.7 - 7.7 K/uL   Lymphocytes Relative 10 (L) 12 - 46 %   Lymphs Abs 0.7 0.7 - 4.0 K/uL   Monocytes Relative 4 3 - 12 %   Monocytes Absolute 0.3 0.1 - 1.0 K/uL   Eosinophils Relative 0 0 - 5 %   Eosinophils Absolute 0.0 0.0 - 0.7 K/uL   Basophils Relative 1 0 - 1 %   Basophils Absolute 0.0 0.0 - 0.1 K/uL  Comprehensive metabolic panel  Result Value Ref Range   Sodium 143 137 - 147 mEq/L   Potassium 3.3 (L) 3.7 - 5.3 mEq/L   Chloride 104 96 - 112 mEq/L   CO2 24 19 - 32 mEq/L   Glucose, Bld 178 (H) 70 - 99 mg/dL   BUN 11 6 - 23 mg/dL   Creatinine, Ser 7.821.16 0.50 - 1.35 mg/dL   Calcium 8.7 8.4 - 95.610.5 mg/dL   Total Protein 7.1 6.0 - 8.3 g/dL   Albumin 3.7 3.5 - 5.2 g/dL   AST 18 0 - 37 U/L   ALT 13 0 - 53 U/L   Alkaline Phosphatase 68 39 - 117 U/L   Total Bilirubin 0.4 0.3 - 1.2 mg/dL   GFR calc non Af Amer 65 (L) >90 mL/min   GFR calc Af Amer 75 (L) >90 mL/min   Anion gap 15 5 - 15  CBG monitoring, ED  Result Value Ref Range   Glucose-Capillary 168 (H) 70 - 99 mg/dL  I-stat troponin, ED (not at [facility])  Result Value Ref Range   Troponin i, poc 0.00 0.00 - 0.08 ng/mL   Comment 3           Mr Brain Wo Contrast  09/11/2014   CLINICAL DATA:  Dizziness since awakening this morning. Initial encounter.  EXAM: MRI HEAD WITHOUT CONTRAST  TECHNIQUE: Multiplanar, multiecho pulse sequences of the brain and surrounding structures were obtained without intravenous contrast.  COMPARISON:  CT head 07/24/2008.  FINDINGS: No evidence for acute infarction, hemorrhage, mass lesion, hydrocephalus, or extra-axial fluid. Normal for age cerebral volume. Mild subcortical and periventricular T2 and FLAIR hyperintensities, likely chronic microvascular  ischemic change. Flow voids are maintained throughout the carotid, basilar, and vertebral arteries. There are no areas of chronic hemorrhage. Pituitary, pineal, and cerebellar tonsils unremarkable. No upper cervical lesions. Mild chronic sinus disease. No mastoid fluid. Extracranial soft tissues unremarkable.  IMPRESSION: No acute intracranial abnormality. No evidence for brainstem stroke or other significant pathology.   Electronically Signed   By: Davonna BellingJohn  Curnes M.D.   On: 09/11/2014 17:39      Richardean Canalavid H Jaquaveon Bilal, MD 09/11/14 [id number]

## 2014-09-11 NOTE — ED Notes (Signed)
Pt resting, watching tv; wife at bedside

## 2014-09-11 NOTE — ED Notes (Signed)
Pt resting, watching tv and talking to family at bedside at this time

## 2014-09-11 NOTE — ED Notes (Signed)
Pt states he just does

## 2014-09-11 NOTE — ED Notes (Signed)
Patient transported to MRI 

## 2014-09-11 NOTE — Discharge Instructions (Signed)
Return here as needed.  Follow-up with your doctor, increase your fluid intake, rest as much as possible  Take meclizline as needed for dizziness.   Return to ER if you have worse dizziness, weakness.

## 2014-09-11 NOTE — ED Notes (Signed)
Pt states dizziness and nausea when he woke up this am, before he even sat up.  Feels as if the room is spinning.  Last woke up at 0330 and pt states he felt normal.

## 2014-09-11 NOTE — ED Notes (Signed)
Pt d/c'd from IV, monitor, continuous pulse oximetry and blood pressure cuff; pt getting discharged to go home; wife at bedside

## 2014-09-11 NOTE — ED Provider Notes (Signed)
CSN: [id number]     Arrival date & time 09/11/14  1047 History   First MD Initiated Contact with Patient 09/11/14 1110     Chief Complaint  Patient presents with  . Dizziness     (Consider location/radiation/quality/duration/timing/severity/associated sxs/prior Treatment) HPI Patient presents to the emergency department with dizziness and feels like the room is spinning when he woke up this morning.  Patient states that he had this once before, very long time ago.  Patient states he does have hypertension but no other medical problems.  The patient denies chest pain, shortness of breath, nausea, vomiting, weakness, lightheadedness, headache, blurred vision, back pain, neck pain, abdominal pain, fever or syncope.  The patient states that nothing seems make his condition, better with certain positions make the condition worse Past Medical History  Diagnosis Date  . Hypertension   . Wears glasses   . BPH (benign prostatic hypertrophy)    Past Surgical History  Procedure Laterality Date  . Carpal tunnel release  2003    right  . Inguinal hernia repair  1992    left  . Knee arthroscopy  1990    right and left  . Colonoscopy    . Carpal tunnel release Left 04/15/2014    Procedure: LEFT CARPAL TUNNEL RELEASE;  Surgeon: Wyn Forsterobert Sypher Jr V, MD;  Location: [facility];  Service: Orthopedics;  Laterality: Left;   No family history on file. History  Substance Use Topics  . Smoking status: Former Smoker    Quit date: 04/14/1984  . Smokeless tobacco: Not on file  . Alcohol Use: No    Review of Systems  All other systems negative except as documented in the HPI. All pertinent positives and negatives as reviewed in the HPI.  Allergies  Review of patient's allergies indicates no known allergies.  Home Medications   Prior to Admission medications   Medication Sig Start Date End Date Taking? Authorizing Provider  amLODipine (NORVASC) 5 MG tablet Take 5 mg by mouth daily.    Yes Historical Provider, MD  aspirin 81 MG tablet Take 81 mg by mouth daily.   Yes Historical Provider, MD  doxazosin (CARDURA) 1 MG tablet Take 1 mg by mouth every evening.   Yes Historical Provider, MD  losartan (COZAAR) 100 MG tablet Take 100 mg by mouth daily.   Yes Historical Provider, MD  oxyCODONE-acetaminophen (PERCOCET/ROXICET) 5-325 MG per tablet Take one or two tablets every 4 to 6 hours as needed for pain Patient not taking: Reported on 09/11/2014 04/15/14   Wyn Forsterobert Sypher Jr V, MD   BP 148/91 mmHg  Pulse 70  Temp(Src) 97.8 F (36.6 C) (Oral)  Resp 20  Ht 6\' 2"  (1.88 m)  Wt 200 lb (90.719 kg)  BMI 25.67 kg/m2  SpO2 98% Physical Exam  Constitutional: He is oriented to person, place, and time. He appears well-developed and well-nourished. No distress.  HENT:  Head: Normocephalic and atraumatic.  Mouth/Throat: Oropharynx is clear and moist.  Eyes: EOM are normal. Pupils are equal, round, and reactive to light.  Neck: Normal range of motion. Neck supple.  Cardiovascular: Normal rate, regular rhythm and normal heart sounds.  Exam reveals no gallop and no friction rub.   No murmur heard. Pulmonary/Chest: Effort normal and breath sounds normal. No respiratory distress.  Musculoskeletal: He exhibits no edema.  Neurological: He is alert and oriented to person, place, and time. He exhibits normal muscle tone.  Patient has no fine motor deficits or focal neurological deficits  Skin: Skin is warm and dry.  Psychiatric: He has a normal mood and affect. His behavior is normal.  Nursing note and vitals reviewed.   ED Course  Procedures (including critical care time) Labs Review Labs Reviewed  DIFFERENTIAL - Abnormal; Notable for the following:    Neutrophils Relative % 85 (*)    Lymphocytes Relative 10 (*)    All other components within normal limits  COMPREHENSIVE METABOLIC PANEL - Abnormal; Notable for the following:    Potassium 3.3 (*)    Glucose, Bld 178 (*)    GFR calc  non Af Amer 65 (*)    GFR calc Af Amer 75 (*)    All other components within normal limits  CBG MONITORING, ED - Abnormal; Notable for the following:    Glucose-Capillary 168 (*)    All other components within normal limits  PROTIME-INR  APTT  CBC  I-STAT TROPOININ, ED    Imaging Review Mr Brain Wo Contrast  09/11/2014   CLINICAL DATA:  Dizziness since awakening this morning. Initial encounter.  EXAM: MRI HEAD WITHOUT CONTRAST  TECHNIQUE: Multiplanar, multiecho pulse sequences of the brain and surrounding structures were obtained without intravenous contrast.  COMPARISON:  CT head 07/24/2008.  FINDINGS: No evidence for acute infarction, hemorrhage, mass lesion, hydrocephalus, or extra-axial fluid. Normal for age cerebral volume. Mild subcortical and periventricular T2 and FLAIR hyperintensities, likely chronic microvascular ischemic change. Flow voids are maintained throughout the carotid, basilar, and vertebral arteries. There are no areas of chronic hemorrhage. Pituitary, pineal, and cerebellar tonsils unremarkable. No upper cervical lesions. Mild chronic sinus disease. No mastoid fluid. Extracranial soft tissues unremarkable.  IMPRESSION: No acute intracranial abnormality. No evidence for brainstem stroke or other significant pathology.   Electronically Signed   By: Davonna BellingJohn  Curnes M.D.   On: 09/11/2014 17:39     EKG Interpretation None     Patient will have an MRI done to observe for any possible posterior circulation stroke.  Patient is advised with plan at all questions were answered.  Patient was left with Dr. Silverio LayYao who will await the MRI results. MDM   Final diagnoses:  Dizziness      Carlyle DollyChristopher W Raizy Auzenne, PA-C 09/12/14 [id number]  Vida RollerBrian D Miller, MD 09/12/14 2102

## 2014-09-11 NOTE — ED Notes (Signed)
Patient given urinal, denies need for assistance

## 2015-11-09 DIAGNOSIS — R972 Elevated prostate specific antigen [PSA]: Secondary | ICD-10-CM | POA: Diagnosis not present

## 2015-11-09 DIAGNOSIS — N521 Erectile dysfunction due to diseases classified elsewhere: Secondary | ICD-10-CM | POA: Diagnosis not present

## 2015-11-18 DIAGNOSIS — I10 Essential (primary) hypertension: Secondary | ICD-10-CM | POA: Diagnosis not present

## 2015-11-18 DIAGNOSIS — R7309 Other abnormal glucose: Secondary | ICD-10-CM | POA: Diagnosis not present

## 2015-11-18 DIAGNOSIS — E785 Hyperlipidemia, unspecified: Secondary | ICD-10-CM | POA: Diagnosis not present

## 2015-11-18 DIAGNOSIS — M1990 Unspecified osteoarthritis, unspecified site: Secondary | ICD-10-CM | POA: Diagnosis not present

## 2015-12-17 DIAGNOSIS — Z6827 Body mass index (BMI) 27.0-27.9, adult: Secondary | ICD-10-CM | POA: Diagnosis not present

## 2015-12-17 DIAGNOSIS — Z720 Tobacco use: Secondary | ICD-10-CM | POA: Diagnosis not present

## 2015-12-17 DIAGNOSIS — Z0000 Encounter for general adult medical examination without abnormal findings: Secondary | ICD-10-CM | POA: Diagnosis not present

## 2015-12-17 DIAGNOSIS — R7309 Other abnormal glucose: Secondary | ICD-10-CM | POA: Diagnosis not present

## 2015-12-17 DIAGNOSIS — I10 Essential (primary) hypertension: Secondary | ICD-10-CM | POA: Diagnosis not present

## 2016-02-17 DIAGNOSIS — N400 Enlarged prostate without lower urinary tract symptoms: Secondary | ICD-10-CM | POA: Diagnosis not present

## 2016-02-17 DIAGNOSIS — I10 Essential (primary) hypertension: Secondary | ICD-10-CM | POA: Diagnosis not present

## 2016-02-17 DIAGNOSIS — E785 Hyperlipidemia, unspecified: Secondary | ICD-10-CM | POA: Diagnosis not present

## 2016-02-17 DIAGNOSIS — R7309 Other abnormal glucose: Secondary | ICD-10-CM | POA: Diagnosis not present

## 2016-04-27 DIAGNOSIS — R972 Elevated prostate specific antigen [PSA]: Secondary | ICD-10-CM | POA: Diagnosis not present

## 2016-05-02 DIAGNOSIS — R972 Elevated prostate specific antigen [PSA]: Secondary | ICD-10-CM | POA: Diagnosis not present

## 2016-05-02 DIAGNOSIS — N5201 Erectile dysfunction due to arterial insufficiency: Secondary | ICD-10-CM | POA: Diagnosis not present

## 2016-05-02 DIAGNOSIS — N400 Enlarged prostate without lower urinary tract symptoms: Secondary | ICD-10-CM | POA: Diagnosis not present

## 2016-05-18 DIAGNOSIS — R7309 Other abnormal glucose: Secondary | ICD-10-CM | POA: Diagnosis not present

## 2016-05-18 DIAGNOSIS — N400 Enlarged prostate without lower urinary tract symptoms: Secondary | ICD-10-CM | POA: Diagnosis not present

## 2016-05-18 DIAGNOSIS — E785 Hyperlipidemia, unspecified: Secondary | ICD-10-CM | POA: Diagnosis not present

## 2016-05-18 DIAGNOSIS — I10 Essential (primary) hypertension: Secondary | ICD-10-CM | POA: Diagnosis not present

## 2016-07-11 DIAGNOSIS — A6009 Herpesviral infection of other urogenital tract: Secondary | ICD-10-CM | POA: Diagnosis not present

## 2016-07-11 DIAGNOSIS — Z20828 Contact with and (suspected) exposure to other viral communicable diseases: Secondary | ICD-10-CM | POA: Diagnosis not present

## 2016-08-22 DIAGNOSIS — R0609 Other forms of dyspnea: Secondary | ICD-10-CM | POA: Diagnosis not present

## 2016-08-22 DIAGNOSIS — I10 Essential (primary) hypertension: Secondary | ICD-10-CM | POA: Diagnosis not present

## 2016-08-22 DIAGNOSIS — R011 Cardiac murmur, unspecified: Secondary | ICD-10-CM | POA: Diagnosis not present

## 2016-08-22 DIAGNOSIS — R7309 Other abnormal glucose: Secondary | ICD-10-CM | POA: Diagnosis not present

## 2016-08-23 DIAGNOSIS — R7309 Other abnormal glucose: Secondary | ICD-10-CM | POA: Diagnosis not present

## 2016-08-23 DIAGNOSIS — I10 Essential (primary) hypertension: Secondary | ICD-10-CM | POA: Diagnosis not present

## 2016-08-23 DIAGNOSIS — E785 Hyperlipidemia, unspecified: Secondary | ICD-10-CM | POA: Diagnosis not present

## 2016-08-25 DIAGNOSIS — R0609 Other forms of dyspnea: Secondary | ICD-10-CM | POA: Diagnosis not present

## 2016-08-25 DIAGNOSIS — R9431 Abnormal electrocardiogram [ECG] [EKG]: Secondary | ICD-10-CM | POA: Diagnosis not present

## 2016-08-25 DIAGNOSIS — I10 Essential (primary) hypertension: Secondary | ICD-10-CM | POA: Diagnosis not present

## 2016-08-25 DIAGNOSIS — R011 Cardiac murmur, unspecified: Secondary | ICD-10-CM | POA: Diagnosis not present

## 2016-11-21 DIAGNOSIS — E785 Hyperlipidemia, unspecified: Secondary | ICD-10-CM | POA: Diagnosis not present

## 2016-11-21 DIAGNOSIS — N400 Enlarged prostate without lower urinary tract symptoms: Secondary | ICD-10-CM | POA: Diagnosis not present

## 2016-11-21 DIAGNOSIS — I10 Essential (primary) hypertension: Secondary | ICD-10-CM | POA: Diagnosis not present

## 2016-11-21 DIAGNOSIS — R7309 Other abnormal glucose: Secondary | ICD-10-CM | POA: Diagnosis not present

## 2017-02-27 DIAGNOSIS — E785 Hyperlipidemia, unspecified: Secondary | ICD-10-CM | POA: Diagnosis not present

## 2017-02-27 DIAGNOSIS — I10 Essential (primary) hypertension: Secondary | ICD-10-CM | POA: Diagnosis not present

## 2017-02-27 DIAGNOSIS — N400 Enlarged prostate without lower urinary tract symptoms: Secondary | ICD-10-CM | POA: Diagnosis not present

## 2017-02-27 DIAGNOSIS — R7309 Other abnormal glucose: Secondary | ICD-10-CM | POA: Diagnosis not present

## 2017-04-18 DIAGNOSIS — R311 Benign essential microscopic hematuria: Secondary | ICD-10-CM | POA: Diagnosis not present

## 2017-04-18 DIAGNOSIS — N401 Enlarged prostate with lower urinary tract symptoms: Secondary | ICD-10-CM | POA: Diagnosis not present

## 2017-04-18 DIAGNOSIS — R3912 Poor urinary stream: Secondary | ICD-10-CM | POA: Diagnosis not present

## 2017-04-27 DIAGNOSIS — R311 Benign essential microscopic hematuria: Secondary | ICD-10-CM | POA: Diagnosis not present

## 2017-04-27 DIAGNOSIS — R319 Hematuria, unspecified: Secondary | ICD-10-CM | POA: Diagnosis not present

## 2017-05-02 DIAGNOSIS — R3121 Asymptomatic microscopic hematuria: Secondary | ICD-10-CM | POA: Diagnosis not present

## 2017-05-08 DIAGNOSIS — H18413 Arcus senilis, bilateral: Secondary | ICD-10-CM | POA: Diagnosis not present

## 2017-05-08 DIAGNOSIS — H04123 Dry eye syndrome of bilateral lacrimal glands: Secondary | ICD-10-CM | POA: Diagnosis not present

## 2017-05-08 DIAGNOSIS — H5203 Hypermetropia, bilateral: Secondary | ICD-10-CM | POA: Diagnosis not present

## 2017-05-08 DIAGNOSIS — H11423 Conjunctival edema, bilateral: Secondary | ICD-10-CM | POA: Diagnosis not present

## 2017-05-08 DIAGNOSIS — H2513 Age-related nuclear cataract, bilateral: Secondary | ICD-10-CM | POA: Diagnosis not present

## 2017-05-08 DIAGNOSIS — H524 Presbyopia: Secondary | ICD-10-CM | POA: Diagnosis not present

## 2017-05-08 DIAGNOSIS — H52223 Regular astigmatism, bilateral: Secondary | ICD-10-CM | POA: Diagnosis not present

## 2017-05-08 DIAGNOSIS — H11153 Pinguecula, bilateral: Secondary | ICD-10-CM | POA: Diagnosis not present

## 2017-05-08 DIAGNOSIS — H25013 Cortical age-related cataract, bilateral: Secondary | ICD-10-CM | POA: Diagnosis not present

## 2017-05-08 DIAGNOSIS — H40013 Open angle with borderline findings, low risk, bilateral: Secondary | ICD-10-CM | POA: Diagnosis not present

## 2017-05-29 DIAGNOSIS — K219 Gastro-esophageal reflux disease without esophagitis: Secondary | ICD-10-CM | POA: Diagnosis not present

## 2017-05-29 DIAGNOSIS — I10 Essential (primary) hypertension: Secondary | ICD-10-CM | POA: Diagnosis not present

## 2017-05-29 DIAGNOSIS — E785 Hyperlipidemia, unspecified: Secondary | ICD-10-CM | POA: Diagnosis not present

## 2017-05-29 DIAGNOSIS — R7309 Other abnormal glucose: Secondary | ICD-10-CM | POA: Diagnosis not present

## 2017-05-29 DIAGNOSIS — N400 Enlarged prostate without lower urinary tract symptoms: Secondary | ICD-10-CM | POA: Diagnosis not present

## 2017-05-29 DIAGNOSIS — M1990 Unspecified osteoarthritis, unspecified site: Secondary | ICD-10-CM | POA: Diagnosis not present

## 2017-08-15 DIAGNOSIS — K219 Gastro-esophageal reflux disease without esophagitis: Secondary | ICD-10-CM | POA: Diagnosis not present

## 2017-08-15 DIAGNOSIS — Z1211 Encounter for screening for malignant neoplasm of colon: Secondary | ICD-10-CM | POA: Diagnosis not present

## 2017-08-15 DIAGNOSIS — Z86010 Personal history of colonic polyps: Secondary | ICD-10-CM | POA: Diagnosis not present

## 2017-08-15 DIAGNOSIS — K449 Diaphragmatic hernia without obstruction or gangrene: Secondary | ICD-10-CM | POA: Diagnosis not present

## 2017-08-18 DIAGNOSIS — Z86010 Personal history of colonic polyps: Secondary | ICD-10-CM | POA: Diagnosis not present

## 2017-08-18 DIAGNOSIS — Z1211 Encounter for screening for malignant neoplasm of colon: Secondary | ICD-10-CM | POA: Diagnosis not present

## 2017-08-28 DIAGNOSIS — R7309 Other abnormal glucose: Secondary | ICD-10-CM | POA: Diagnosis not present

## 2017-08-28 DIAGNOSIS — I10 Essential (primary) hypertension: Secondary | ICD-10-CM | POA: Diagnosis not present

## 2017-08-28 DIAGNOSIS — E785 Hyperlipidemia, unspecified: Secondary | ICD-10-CM | POA: Diagnosis not present

## 2017-08-28 DIAGNOSIS — N400 Enlarged prostate without lower urinary tract symptoms: Secondary | ICD-10-CM | POA: Diagnosis not present

## 2017-08-28 DIAGNOSIS — M1990 Unspecified osteoarthritis, unspecified site: Secondary | ICD-10-CM | POA: Diagnosis not present

## 2017-08-28 DIAGNOSIS — N529 Male erectile dysfunction, unspecified: Secondary | ICD-10-CM | POA: Diagnosis not present

## 2019-12-20 ENCOUNTER — Ambulatory Visit: Payer: Self-pay | Attending: Internal Medicine

## 2019-12-20 DIAGNOSIS — Z23 Encounter for immunization: Secondary | ICD-10-CM | POA: Insufficient documentation

## 2019-12-20 NOTE — Progress Notes (Signed)
   [id number]  Name:  Francisco Simmons.    MRN: [id number] DOB: 11/20/1950  12/20/2019  Francisco Simmons was observed post Covid-19 immunization for 15 minutes without incidence. He was provided with Vaccine Information Sheet and instruction to access the V-Safe system.   Francisco Simmons was instructed to call 911 with any severe reactions post vaccine: Marland Kitchen Difficulty breathing  . Swelling of your face and throat  . A fast heartbeat  . A bad rash all over your body  . Dizziness and weakness    Immunizations Administered    Name Date Dose VIS Date Route   Pfizer COVID-19 Vaccine 12/20/2019  9:22 AM 0.3 mL 10/04/2019 Intramuscular   Manufacturer: [employer], [employer]   Lot: [id number]   NDC: [address]-4

## 2020-01-14 ENCOUNTER — Ambulatory Visit: Payer: Self-pay | Attending: Internal Medicine

## 2020-01-14 DIAGNOSIS — Z23 Encounter for immunization: Secondary | ICD-10-CM

## 2020-01-14 NOTE — Progress Notes (Signed)
   [facility]  Name:  Francisco Simmons.    MRN: [id number] DOB: August 28, 1951  01/14/2020  Francisco Simmons was observed post Covid-19 immunization for 15 minutes without incident. He was provided with Vaccine Information Sheet and instruction to access the V-Safe system.   Francisco Simmons was instructed to call 911 with any severe reactions post vaccine: Marland Kitchen Difficulty breathing  . Swelling of face and throat  . A fast heartbeat  . A bad rash all over body  . Dizziness and weakness   Immunizations Administered    Name Date Dose VIS Date Route   Pfizer COVID-19 Vaccine 01/14/2020 11:14 AM 0.3 mL 10/04/2019 Intramuscular   Manufacturer: [employer], [employer]   Lot: [id number]   NDC: [address]-1

## 2020-12-07 DIAGNOSIS — N189 Chronic kidney disease, unspecified: Secondary | ICD-10-CM | POA: Diagnosis not present

## 2020-12-07 DIAGNOSIS — N400 Enlarged prostate without lower urinary tract symptoms: Secondary | ICD-10-CM | POA: Diagnosis not present

## 2020-12-07 DIAGNOSIS — E785 Hyperlipidemia, unspecified: Secondary | ICD-10-CM | POA: Diagnosis not present

## 2020-12-07 DIAGNOSIS — M170 Bilateral primary osteoarthritis of knee: Secondary | ICD-10-CM | POA: Diagnosis not present

## 2020-12-07 DIAGNOSIS — I10 Essential (primary) hypertension: Secondary | ICD-10-CM | POA: Diagnosis not present

## 2020-12-07 DIAGNOSIS — R7303 Prediabetes: Secondary | ICD-10-CM | POA: Diagnosis not present

## 2021-01-19 ENCOUNTER — Encounter: Payer: Self-pay | Admitting: Orthopedic Surgery

## 2021-01-19 DIAGNOSIS — M1711 Unilateral primary osteoarthritis, right knee: Secondary | ICD-10-CM | POA: Diagnosis not present

## 2021-01-19 DIAGNOSIS — I10 Essential (primary) hypertension: Secondary | ICD-10-CM | POA: Diagnosis present

## 2021-01-19 DIAGNOSIS — M1712 Unilateral primary osteoarthritis, left knee: Secondary | ICD-10-CM | POA: Diagnosis present

## 2021-01-19 DIAGNOSIS — N400 Enlarged prostate without lower urinary tract symptoms: Secondary | ICD-10-CM | POA: Diagnosis present

## 2021-01-19 HISTORY — DX: Unilateral primary osteoarthritis, right knee: M17.11

## 2021-01-19 NOTE — H&P (Signed)
TOTAL KNEE ADMISSION H&P  Patient is being admitted for right total knee arthroplasty.  Subjective:  Chief Complaint:right knee pain.  HPI: Francisco Broner., 70 y.o. male, has a history of pain and functional disability in the right knee due to arthritis and has failed non-surgical conservative treatments for greater than 12 weeks to includeNSAID's and/or analgesics, corticosteriod injections, viscosupplementation injections, flexibility and strengthening excercises, supervised PT with diminished ADL's post treatment, use of assistive devices and activity modification.  Onset of symptoms was gradual, starting 10 years ago with gradually worsening course since that time. The patient noted prior procedures on the knee to include  arthroscopy and menisectomy on the right knee(s).  Patient currently rates pain in the right knee(s) at 10 out of 10 with activity. Patient has night pain, worsening of pain with activity and weight bearing, pain that interferes with activities of daily living, crepitus and joint swelling.  Patient has evidence of subchondral sclerosis, periarticular osteophytes and joint space narrowing by imaging studies. There is no active infection.  Patient Active Problem List   Diagnosis Date Noted  . Primary localized osteoarthritis of right knee 01/19/2021  . Hypertension   . Primary localized osteoarthritis of left knee   . Benign prostatic hyperplasia    Past Medical History:  Diagnosis Date  . BPH (benign prostatic hypertrophy)   . Hypertension   . Primary localized osteoarthritis of right knee 01/19/2021  . Wears glasses     Past Surgical History:  Procedure Laterality Date  . CARPAL TUNNEL RELEASE  2003   right  . CARPAL TUNNEL RELEASE Left 04/15/2014   Procedure: LEFT CARPAL TUNNEL RELEASE;  Surgeon: Wyn Forster, MD;  Location: [facility];  Service: Orthopedics;  Laterality: Left;  . COLONOSCOPY    . INGUINAL HERNIA REPAIR  1992   left  .  KNEE ARTHROSCOPY  1990   right and left    No current facility-administered medications for this encounter.   Current Outpatient Medications  Medication Sig Dispense Refill Last Dose  . amLODipine (NORVASC) 10 MG tablet Take 10 mg by mouth daily.     Marland Kitchen aspirin 81 MG tablet Take 81 mg by mouth daily.     Marland Kitchen atorvastatin (LIPITOR) 20 MG tablet Take 20 mg by mouth at bedtime.     . celecoxib (CELEBREX) 200 MG capsule Take 200 mg by mouth daily as needed for moderate pain (knee).     Marland Kitchen doxazosin (CARDURA) 2 MG tablet Take 2 mg by mouth every evening.     Marland Kitchen losartan (COZAAR) 100 MG tablet Take 100 mg by mouth daily.      No Known Allergies  Social History   Tobacco Use  . Smoking status: Former Smoker    Quit date: 04/14/1984    Years since quitting: 36.7  . Smokeless tobacco: Not on file  Substance Use Topics  . Alcohol use: No    Family History  Problem Relation Age of Onset  . Hypertension Mother   . Lung cancer Father      Review of Systems  Constitutional: Negative.   HENT: Negative.   Eyes: Negative.   Respiratory: Negative.   Cardiovascular: Negative.   Gastrointestinal: Negative.   Endocrine: Negative.   Genitourinary: Negative.   Musculoskeletal: Positive for back pain and gait problem.  Allergic/Immunologic: Negative.   Hematological: Negative.   Psychiatric/Behavioral: Negative.     Objective:  Physical Exam Constitutional:      Appearance: Normal appearance. He  is normal weight.  HENT:     Head: Normocephalic.     Right Ear: External ear normal.     Left Ear: External ear normal.     Nose: Nose normal.     Mouth/Throat:     Mouth: Mucous membranes are moist.  Eyes:     Conjunctiva/sclera: Conjunctivae normal.  Cardiovascular:     Rate and Rhythm: Normal rate.     Pulses: Normal pulses.  Pulmonary:     Effort: Pulmonary effort is normal.  Abdominal:     Palpations: Abdomen is soft.  Genitourinary:    Comments: Not pertinent to current  symptomatology therefore not examined. Musculoskeletal:     Cervical back: Neck supple.     Comments: Examination of his left knee reveals pain medially and laterally.  Moderate varus deformity.  Range of motion 0-120 degrees.  Knee is stable with normal patella tracking.  Examination of the right knee reveals minimal pain.  1+ crepitation.  Mild varus deformity.  Range of motion 0-120 degrees.  Knee is stable with normal patella tracking.    Skin:    General: Skin is warm.     Capillary Refill: Capillary refill takes less than 2 seconds.  Neurological:     General: No focal deficit present.     Mental Status: He is alert.  Psychiatric:        Mood and Affect: Mood normal.        Behavior: Behavior normal.     Vital signs in last 24 hours: Temp:  [98 F (36.7 C)] 98 F (36.7 C) (03/29 1100) Pulse Rate:  [62] 62 (03/29 1100) BP: (154)/(90) 154/90 (03/29 1100) SpO2:  [97 %] 97 % (03/29 1100) Weight:  [94.8 kg] 94.8 kg (03/29 1100)  Labs:   Estimated body mass index is 27.57 kg/m as calculated from the following:   Height as of this encounter: 6\' 1"  (1.854 m).   Weight as of this encounter: 94.8 kg.   Imaging Review Plain radiographs demonstrate severe degenerative joint disease of the bilaterally knee(s). The overall alignment issignificant varus. The bone quality appears to be good for age and reported activity level.      Assessment/Plan:  End stage arthritis, right knee  Principal Problem:   Primary localized osteoarthritis of right knee Active Problems:   Hypertension   Primary localized osteoarthritis of left knee   Benign prostatic hyperplasia   The patient history, physical examination, clinical judgment of the provider and imaging studies are consistent with end stage degenerative joint disease of the right knee(s) and total knee arthroplasty is deemed medically necessary. The treatment options including medical management, injection therapy arthroscopy and  arthroplasty were discussed at length. The risks and benefits of total knee arthroplasty were presented and reviewed. The risks due to aseptic loosening, infection, stiffness, patella tracking problems, thromboembolic complications and other imponderables were discussed. The patient acknowledged the explanation, agreed to proceed with the plan and consent was signed. Patient is being admitted for inpatient treatment for surgery, pain control, PT, OT, prophylactic antibiotics, VTE prophylaxis, progressive ambulation and ADL's and discharge planning.   The patient is planning to be discharged to home the day of surgery with his wife, as his care taker.  Francisco Simmons's cell phone number is [phone]  If he is having trouble progressing   We will keep him overnight  Physical therapy starts at our office on 4/13 at 10 am Follow up with Dr 5/13 is on 4/19 at 11:15

## 2021-01-21 ENCOUNTER — Encounter (HOSPITAL_COMMUNITY): Admission: RE | Admit: 2021-01-21 | Payer: Self-pay | Source: Ambulatory Visit

## 2021-01-21 NOTE — Progress Notes (Addendum)
COVID Vaccine Completed: x3 Date COVID Vaccine completed:  12-20-19 01-14-20 Has received booster:  11-21 COVID vaccine manufacturer: Pfizer     Date of COVID positive in last 90 days:  N/A  PCP - Rinaldo Cloud, MD Cardiologist - N/A  Chest x-ray - N/A EKG - 12-07-20 on chart Stress Test -  ECHO -  Cardiac Cath -  Pacemaker/ICD device last checked: Spinal Cord Stimulator:  Sleep Study - N/A CPAP -   Fasting Blood Sugar - N/A Checks Blood Sugar _____ times a day  Blood Thinner Instructions: Aspirin Instructions:  ASA 81 mg  Last Dose: 01-24-21  Activity level:  Can go up a flight of stairs and perform activities of daily living without stopping and without symptoms of chest pain or shortness of breath.    Anesthesia review:  N/A  Patient denies shortness of breath, fever, cough and chest pain at PAT appointment   Patient verbalized understanding of instructions that were given to them at the PAT appointment. Patient was also instructed that they will need to review over the PAT instructions again at home before surgery.

## 2021-01-21 NOTE — Patient Instructions (Addendum)
DUE TO COVID-19 ONLY ONE VISITOR IS ALLOWED TO COME WITH YOU AND STAY IN THE WAITING ROOM ONLY DURING PRE OP AND PROCEDURE.   **NO VISITORS ARE ALLOWED IN THE SHORT STAY AREA OR RECOVERY ROOM!!**  IF YOU WILL BE ADMITTED INTO THE HOSPITAL YOU ARE ALLOWED ONLY TWO SUPPORT PEOPLE DURING VISITATION HOURS ONLY (10AM -8PM)   . The support person(s) may change daily. . The support person(s) must pass our screening, gel in and out, and wear a mask at all times, including in the patient's room. . Patients must also wear a mask when staff or their support person are in the room.  No visitors under the age of 31. Any visitor under the age of 5 must be accompanied by an adult.    COVID SWAB TESTING MUST BE COMPLETED ON:  Thursday, 01-28-21 @ 11:00 AM   [address] [place], [place] [address]  (Must self quarantine after testing. Follow instructions on handout.)        Your procedure is scheduled on:  Monday, 02-01-21   Report to [facility] Main  Entrance   Report to Short Stay at 5:15 AM   [facility])    Call this number if you have problems the morning of surgery [phone]   Do not eat food :After Midnight.   May have liquids until 4:15 AM day of surgery  CLEAR LIQUID DIET  Foods Allowed                                                                     Foods Excluded  Water, Black Coffee and tea, regular and decaf             liquids that you cannot  Plain Jell-O in any flavor  (No red)                                    see through such as: Fruit ices (not with fruit pulp)                                      milk, soups, orange juice              Iced Popsicles (No red)                                      All solid food                                   Apple juices Sports drinks like Gatorade (No red) Lightly seasoned clear broth or consume(fat free) Sugar, honey syrup    Complete one Ensure drink the morning of surgery at 4:15 AM the day of surgery.         1. The day of surgery:  ? Drink ONE (1) Pre-Surgery Clear Ensure or G2 by am the morning of surgery. Drink in one sitting. Do not sip.  ?  This drink was given to you during your hospital  pre-op appointment visit. ? Nothing else to drink after completing the  Pre-Surgery Clear Ensure or G2.          If you have questions, please contact your surgeon's office.     Oral Hygiene is also important to reduce your risk of infection.                                    Remember - BRUSH YOUR TEETH THE MORNING OF SURGERY WITH YOUR REGULAR TOOTHPASTE   Do NOT smoke after Midnight   Take these medicines the morning of surgery with A SIP OF WATER:  Amlodipine                          You may not have any metal on your body including and body piercings             Do not wear lotions, powders, cologne, or deodorant             Men may shave face and neck.   Do not bring valuables to the hospital. [facility] IS NOT             RESPONSIBLE   FOR VALUABLES.   Contacts, dentures or bridgework may not be worn into surgery.   Bring small overnight bag day of surgery.                 Please read over the following fact sheets you were given: IF YOU HAVE QUESTIONS ABOUT YOUR PRE OP INSTRUCTIONS PLEASE CALL  [phone] [facility] - Preparing for Surgery Before surgery, you can play an important role.  Because skin is not sterile, your skin needs to be as free of germs as possible.  You can reduce the number of germs on your skin by washing with CHG (chlorahexidine gluconate) soap before surgery.  CHG is an antiseptic cleaner which kills germs and bonds with the skin to continue killing germs even after washing. Please DO NOT use if you have an allergy to CHG or antibacterial soaps.  If your skin becomes reddened/irritated stop using the CHG and inform your nurse when you arrive at Short Stay. Do not shave (including legs and underarms) for at least 48 hours prior to the first CHG shower.   You may shave your face/neck.  Please follow these instructions carefully:  1.  Shower with CHG Soap the night before surgery and the  morning of surgery.  2.  If you choose to wash your hair, wash your hair first as usual with your normal  shampoo.  3.  After you shampoo, rinse your hair and body thoroughly to remove the shampoo.                             4.  Use CHG as you would any other liquid soap.  You can apply chg directly to the skin and wash.  Gently with a scrungie or clean washcloth.  5.  Apply the CHG Soap to your body ONLY FROM THE NECK DOWN.   Do   not use on face/ open  Wound or open sores. Avoid contact with eyes, ears mouth and   genitals (private parts).                       Wash face,  Genitals (private parts) with your normal soap.             6.  Wash thoroughly, paying special attention to the area where your    surgery  will be performed.  7.  Thoroughly rinse your body with warm water from the neck down.  8.  DO NOT shower/wash with your normal soap after using and rinsing off the CHG Soap.                9.  Pat yourself dry with a clean towel.            10.  Wear clean pajamas.            11.  Place clean sheets on your bed the night of your first shower and do not  sleep with pets. Day of Surgery : Do not apply any lotions/deodorants the morning of surgery.  Please wear clean clothes to the hospital/surgery center.  FAILURE TO FOLLOW THESE INSTRUCTIONS MAY RESULT IN THE CANCELLATION OF YOUR SURGERY  PATIENT SIGNATURE_________________________________  NURSE SIGNATURE__________________________________  ________________________________________________________________________   Francisco Simmons  An incentive spirometer is a tool that can help keep your lungs clear and active. This tool measures how well you are filling your lungs with each breath. Taking long deep breaths may help reverse or decrease the chance of developing  breathing (pulmonary) problems (especially infection) following:  A long period of time when you are unable to move or be active. BEFORE THE PROCEDURE   If the spirometer includes an indicator to show your best effort, your nurse or respiratory therapist will set it to a desired goal.  If possible, sit up straight or lean slightly forward. Try not to slouch.  Hold the incentive spirometer in an upright position. INSTRUCTIONS FOR USE  1. Sit on the edge of your bed if possible, or sit up as far as you can in bed or on a chair. 2. Hold the incentive spirometer in an upright position. 3. Breathe out normally. 4. Place the mouthpiece in your mouth and seal your lips tightly around it. 5. Breathe in slowly and as deeply as possible, raising the piston or the ball toward the top of the column. 6. Hold your breath for 3-5 seconds or for as long as possible. Allow the piston or ball to fall to the bottom of the column. 7. Remove the mouthpiece from your mouth and breathe out normally. 8. Rest for a few seconds and repeat Steps 1 through 7 at least 10 times every 1-2 hours when you are awake. Take your time and take a few normal breaths between deep breaths. 9. The spirometer may include an indicator to show your best effort. Use the indicator as a goal to work toward during each repetition. 10. After each set of 10 deep breaths, practice coughing to be sure your lungs are clear. If you have an incision (the cut made at the time of surgery), support your incision when coughing by placing a pillow or rolled up towels firmly against it. Once you are able to get out of bed, walk around indoors and cough well. You may stop using the incentive spirometer when instructed by your caregiver.  RISKS AND COMPLICATIONS  Take your time  so you do not get dizzy or light-headed.  If you are in pain, you may need to take or ask for pain medication before doing incentive spirometry. It is harder to take a deep  breath if you are having pain. AFTER USE  Rest and breathe slowly and easily.  It can be helpful to keep track of a log of your progress. Your caregiver can provide you with a simple table to help with this. If you are using the spirometer at home, follow these instructions: [place] IF:   You are having difficultly using the spirometer.  You have trouble using the spirometer as often as instructed.  Your pain medication is not giving enough relief while using the spirometer.  You develop fever of 100.5 F (38.1 C) or higher. SEEK IMMEDIATE MEDICAL CARE IF:   You cough up bloody sputum that had not been present before.  You develop fever of 102 F (38.9 C) or greater.  You develop worsening pain at or near the incision site. MAKE SURE YOU:   Understand these instructions.  Will watch your condition.  Will get help right away if you are not doing well or get worse. Document Released: 02/20/2007 Document Revised: 01/02/2012 Document Reviewed: 04/23/2007 [facility] Patient Information 2014 [place], [place].   ________________________________________________________________________

## 2021-01-25 ENCOUNTER — Other Ambulatory Visit: Payer: Self-pay

## 2021-01-25 ENCOUNTER — Encounter (HOSPITAL_COMMUNITY)
Admission: RE | Admit: 2021-01-25 | Discharge: 2021-01-25 | Disposition: A | Discharge status: Home / Self Care | Payer: Medicare HMO | Source: Ambulatory Visit | Attending: Orthopedic Surgery | Admitting: Orthopedic Surgery

## 2021-01-25 ENCOUNTER — Encounter (HOSPITAL_COMMUNITY): Payer: Self-pay

## 2021-01-25 DIAGNOSIS — Z01812 Encounter for preprocedural laboratory examination: Secondary | ICD-10-CM | POA: Diagnosis not present

## 2021-01-25 HISTORY — DX: Other complications of anesthesia, initial encounter: T88.59XA

## 2021-01-25 HISTORY — DX: Gastro-esophageal reflux disease without esophagitis: K21.9

## 2021-01-25 LAB — COMPREHENSIVE METABOLIC PANEL
ALT: 21 U/L (ref 0–44)
AST: 21 U/L (ref 15–41)
Albumin: 3.9 g/dL (ref 3.5–5.0)
Alkaline Phosphatase: 63 U/L (ref 38–126)
Anion gap: 8 (ref 5–15)
BUN: 16 mg/dL (ref 8–23)
CO2: 28 mmol/L (ref 22–32)
Calcium: 8.6 mg/dL — ABNORMAL LOW (ref 8.9–10.3)
Chloride: 105 mmol/L (ref 98–111)
Creatinine, Ser: 1.19 mg/dL (ref 0.61–1.24)
GFR, Estimated: >60 mL/min (ref >60)
Glucose, Bld: 108 mg/dL — ABNORMAL HIGH (ref 70–99)
Potassium: 3.1 mmol/L — ABNORMAL LOW (ref 3.5–5.1)
Sodium: 141 mmol/L (ref 135–145)
Total Bilirubin: 0.5 mg/dL (ref 0.3–1.2)
Total Protein: 7.3 g/dL (ref 6.5–8.1)

## 2021-01-25 LAB — CBC WITH DIFFERENTIAL/PLATELET
Abs Immature Granulocytes: 0.01 10*3/uL (ref 0.00–0.07)
Basophils Absolute: 0.1 10*3/uL (ref 0.0–0.1)
Basophils Relative: 1 %
Eosinophils Absolute: 0.4 10*3/uL (ref 0.0–0.5)
Eosinophils Relative: 9 %
HCT: 42.6 % (ref 39.0–52.0)
Hemoglobin: 14.3 g/dL (ref 13.0–17.0)
Immature Granulocytes: 0 %
Lymphocytes Relative: 34 %
Lymphs Abs: 1.6 10*3/uL (ref 0.7–4.0)
MCH: 30.2 pg (ref 26.0–34.0)
MCHC: 33.6 g/dL (ref 30.0–36.0)
MCV: 89.9 fL (ref 80.0–100.0)
Monocytes Absolute: 0.5 10*3/uL (ref 0.1–1.0)
Monocytes Relative: 11 %
Neutro Abs: 2.2 10*3/uL (ref 1.7–7.7)
Neutrophils Relative %: 45 %
Platelets: 251 10*3/uL (ref 150–400)
RBC: 4.74 MIL/uL (ref 4.22–5.81)
RDW: 12.8 % (ref 11.5–15.5)
WBC: 4.9 10*3/uL (ref 4.0–10.5)
nRBC: 0 % (ref 0.0–0.2)

## 2021-01-25 LAB — PROTIME-INR
INR: 1 (ref 0.8–1.2)
Prothrombin Time: 13.1 seconds (ref 11.4–15.2)

## 2021-01-25 LAB — SURGICAL PCR SCREEN
MRSA, PCR: NEGATIVE
Staphylococcus aureus: POSITIVE — AB

## 2021-01-25 LAB — APTT: aPTT: 30 seconds (ref 24–36)

## 2021-01-25 NOTE — Progress Notes (Signed)
PCR results sent to Dr. Wainer to review.  

## 2021-01-26 LAB — URINE CULTURE: Culture: NO GROWTH

## 2021-01-26 NOTE — Care Plan (Signed)
Ortho Bundle Case Management Note  Patient Details  Name: Francisco Simmons. MRN: [id number] Date of Birth: 07/28/1951  Met with patient in the office prior to surgery. He will discharge to home with family to assist. Rolling walker and CPM ordered from [facility] to be delivered prior to surgery.   OPPT set up with [facility]  Patient and MD in agreement with plan. Choice offered                   DME Arranged:  Walker rolling,CPM DME Agency:  [employer]  HH Arranged:    [place] Agency:     Additional Comments: Please contact me with any questions of if this plan should need to change.  Ladell Heads,  [place] [facility]  [phone] 01/26/2021, 10:24 AM

## 2021-01-28 ENCOUNTER — Other Ambulatory Visit (HOSPITAL_COMMUNITY)
Admission: RE | Admit: 2021-01-28 | Discharge: 2021-01-28 | Disposition: A | Discharge status: Home / Self Care | Payer: Medicare HMO | Source: Ambulatory Visit | Attending: Orthopedic Surgery | Admitting: Orthopedic Surgery

## 2021-01-28 DIAGNOSIS — Z01812 Encounter for preprocedural laboratory examination: Secondary | ICD-10-CM | POA: Insufficient documentation

## 2021-01-28 DIAGNOSIS — Z20822 Contact with and (suspected) exposure to covid-19: Secondary | ICD-10-CM | POA: Diagnosis not present

## 2021-01-28 LAB — SARS CORONAVIRUS 2 (TAT 6-24 HRS): SARS Coronavirus 2: NEGATIVE

## 2021-01-31 MED ORDER — BUPIVACAINE LIPOSOME 1.3 % IJ SUSP
20.0000 mL | Freq: Once | INTRAMUSCULAR | Status: DC
  Filled 2021-01-31: qty 20

## 2021-01-31 NOTE — Anesthesia Preprocedure Evaluation (Addendum)
Anesthesia Evaluation  Patient identified by MRN, date of birth, ID band Patient awake    Reviewed: Allergy & Precautions, NPO status , Patient's Chart, lab work & pertinent test results  History of Anesthesia Complications (+) PROLONGED EMERGENCE  Airway Mallampati: II  TM Distance: >3 FB Neck ROM: Full    Dental  (+) Partial Lower   Pulmonary neg pulmonary ROS, former smoker,    Pulmonary exam normal        Cardiovascular hypertension, Pt. on medications  Rhythm:Regular Rate:Normal     Neuro/Psych negative neurological ROS  negative psych ROS   GI/Hepatic Neg liver ROS, GERD  Controlled,  Endo/Other  negative endocrine ROS  Renal/GU negative Renal ROS   BPH    Musculoskeletal  (+) Arthritis , Osteoarthritis,    Abdominal (+)  Abdomen: soft. Bowel sounds: normal.  Peds  Hematology negative hematology ROS (+)   Anesthesia Other Findings   Reproductive/Obstetrics                            Anesthesia Physical Anesthesia Plan  ASA: II  Anesthesia Plan: MAC, Spinal and Regional   Post-op Pain Management:  Regional for Post-op pain   Induction: Intravenous  PONV Risk Score and Plan: 1 and Ondansetron, Dexamethasone, Midazolam and Treatment may vary due to age or medical condition  Airway Management Planned: Simple Face Mask, Natural Airway and Nasal Cannula  Additional Equipment: None  Intra-op Plan:   Post-operative Plan:   Informed Consent: I have reviewed the patients History and Physical, chart, labs and discussed the procedure including the risks, benefits and alternatives for the proposed anesthesia with the patient or authorized representative who has indicated his/her understanding and acceptance.     Dental advisory given  Plan Discussed with: CRNA  Anesthesia Plan Comments: (Lab Results      Component                Value               Date                       WBC                      4.9                 01/25/2021                HGB                      14.3                01/25/2021                HCT                      42.6                01/25/2021                MCV                      89.9                01/25/2021                PLT  251                 01/25/2021           Lab Results      Component                Value               Date                      NA                       141                 01/25/2021                K                        3.1 (L)             01/25/2021                CO2                      28                  01/25/2021                GLUCOSE                  108 (H)             01/25/2021                BUN                      16                  01/25/2021                CREATININE               1.19                01/25/2021                CALCIUM                  8.6 (L)             01/25/2021                GFRNONAA                 >60                 01/25/2021                GFRAA                    75 (L)              09/11/2014          )       Anesthesia Quick Evaluation

## 2021-02-01 ENCOUNTER — Encounter (HOSPITAL_COMMUNITY)
Admission: RE | Disposition: A | Discharge status: Home / Self Care | Payer: Self-pay | Source: Home / Self Care | Attending: Orthopedic Surgery

## 2021-02-01 ENCOUNTER — Ambulatory Visit (HOSPITAL_COMMUNITY): Payer: Medicare HMO | Admitting: Certified Registered Nurse Anesthetist

## 2021-02-01 ENCOUNTER — Encounter (HOSPITAL_COMMUNITY): Payer: Self-pay | Admitting: Orthopedic Surgery

## 2021-02-01 ENCOUNTER — Observation Stay (HOSPITAL_COMMUNITY)
Admission: RE | Admit: 2021-02-01 | Discharge: 2021-02-02 | Disposition: A | Discharge status: Home / Self Care | Payer: Medicare HMO | Attending: Orthopedic Surgery | Admitting: Orthopedic Surgery

## 2021-02-01 ENCOUNTER — Other Ambulatory Visit: Payer: Self-pay

## 2021-02-01 DIAGNOSIS — Z87891 Personal history of nicotine dependence: Secondary | ICD-10-CM | POA: Insufficient documentation

## 2021-02-01 DIAGNOSIS — I10 Essential (primary) hypertension: Secondary | ICD-10-CM | POA: Diagnosis present

## 2021-02-01 DIAGNOSIS — Z7982 Long term (current) use of aspirin: Secondary | ICD-10-CM | POA: Insufficient documentation

## 2021-02-01 DIAGNOSIS — M1711 Unilateral primary osteoarthritis, right knee: Secondary | ICD-10-CM | POA: Diagnosis not present

## 2021-02-01 DIAGNOSIS — N400 Enlarged prostate without lower urinary tract symptoms: Secondary | ICD-10-CM | POA: Diagnosis not present

## 2021-02-01 DIAGNOSIS — Z79899 Other long term (current) drug therapy: Secondary | ICD-10-CM | POA: Insufficient documentation

## 2021-02-01 DIAGNOSIS — G8918 Other acute postprocedural pain: Secondary | ICD-10-CM | POA: Diagnosis not present

## 2021-02-01 DIAGNOSIS — M1712 Unilateral primary osteoarthritis, left knee: Secondary | ICD-10-CM | POA: Diagnosis present

## 2021-02-01 HISTORY — DX: Unilateral primary osteoarthritis, left knee: M17.12

## 2021-02-01 HISTORY — PX: TOTAL KNEE ARTHROPLASTY: SHX125

## 2021-02-01 SURGERY — ARTHROPLASTY, KNEE, TOTAL
Anesthesia: Monitor Anesthesia Care | Site: Knee | Laterality: Right

## 2021-02-01 MED ORDER — MENTHOL 3 MG MT LOZG: 1.0000 | LOZENGE | OROMUCOSAL | Status: DC | PRN

## 2021-02-01 MED ORDER — METOCLOPRAMIDE HCL 5 MG/ML IJ SOLN: 5.0000 mg | Freq: Three times a day (TID) | INTRAMUSCULAR | Status: DC | PRN

## 2021-02-01 MED ORDER — LACTATED RINGERS IV SOLN
INTRAVENOUS | Status: DC
  Administered 2021-02-01: 1000 mL via INTRAVENOUS

## 2021-02-01 MED ORDER — SODIUM CHLORIDE 0.9 % IV SOLN
INTRAVENOUS | Status: DC | PRN
  Administered 2021-02-01: 1.5 g via INTRAVENOUS

## 2021-02-01 MED ORDER — METOCLOPRAMIDE HCL 5 MG PO TABS
5.0000 mg | ORAL_TABLET | Freq: Three times a day (TID) | ORAL | Status: DC | PRN
  Filled 2021-02-01: qty 2

## 2021-02-01 MED ORDER — OXYCODONE HCL 5 MG PO TABS
5.0000 mg | ORAL_TABLET | ORAL | Status: DC | PRN
  Administered 2021-02-02: 5 mg via ORAL
  Filled 2021-02-01: qty 2
  Filled 2021-02-01: qty 1

## 2021-02-01 MED ORDER — DEXAMETHASONE SODIUM PHOSPHATE 10 MG/ML IJ SOLN
8.0000 mg | Freq: Once | INTRAMUSCULAR | Status: DC
Start: 2021-02-01 — End: 2021-02-01

## 2021-02-01 MED ORDER — BUPIVACAINE LIPOSOME 1.3 % IJ SUSP
INTRAMUSCULAR | Status: DC | PRN
  Administered 2021-02-01: 20 mL

## 2021-02-01 MED ORDER — FENTANYL CITRATE (PF) 100 MCG/2ML IJ SOLN
INTRAMUSCULAR | Status: DC | PRN
  Administered 2021-02-01 (×2): 50 ug via INTRAVENOUS

## 2021-02-01 MED ORDER — FENTANYL CITRATE (PF) 100 MCG/2ML IJ SOLN
INTRAMUSCULAR | Status: AC
  Filled 2021-02-01: qty 2

## 2021-02-01 MED ORDER — PROPOFOL 10 MG/ML IV BOLUS
INTRAVENOUS | Status: DC | PRN
  Administered 2021-02-01 (×2): 10 mg via INTRAVENOUS

## 2021-02-01 MED ORDER — POVIDONE-IODINE 10 % EX SWAB: 2.0000 "application " | Freq: Once | CUTANEOUS | Status: DC

## 2021-02-01 MED ORDER — CEFUROXIME SODIUM 1.5 G IV SOLR
INTRAVENOUS | Status: AC
  Filled 2021-02-01: qty 1.5

## 2021-02-01 MED ORDER — PROPOFOL 500 MG/50ML IV EMUL
INTRAVENOUS | Status: DC | PRN
  Administered 2021-02-01: 75 ug/kg/min via INTRAVENOUS

## 2021-02-01 MED ORDER — ASPIRIN EC 325 MG PO TBEC
325.0000 mg | DELAYED_RELEASE_TABLET | Freq: Every day | ORAL | Status: DC
  Administered 2021-02-02: 325 mg via ORAL
  Filled 2021-02-01: qty 1

## 2021-02-01 MED ORDER — DIPHENHYDRAMINE HCL 12.5 MG/5ML PO ELIX: 12.5000 mg | ORAL_SOLUTION | ORAL | Status: DC | PRN

## 2021-02-01 MED ORDER — LACTATED RINGERS IV SOLN: INTRAVENOUS | Status: DC

## 2021-02-01 MED ORDER — CHLORHEXIDINE GLUCONATE 0.12 % MT SOLN: 15.0000 mL | Freq: Once | OROMUCOSAL | Status: AC

## 2021-02-01 MED ORDER — MIDAZOLAM HCL 2 MG/2ML IJ SOLN
INTRAMUSCULAR | Status: AC
  Filled 2021-02-01: qty 2

## 2021-02-01 MED ORDER — SODIUM CHLORIDE 0.9 % IR SOLN
Status: DC | PRN
  Administered 2021-02-01: 2000 mL

## 2021-02-01 MED ORDER — BUPIVACAINE-EPINEPHRINE 0.25% -1:200000 IJ SOLN
INTRAMUSCULAR | Status: DC | PRN
  Administered 2021-02-01: 30 mL

## 2021-02-01 MED ORDER — ONDANSETRON HCL 4 MG/2ML IJ SOLN
4.0000 mg | Freq: Four times a day (QID) | INTRAMUSCULAR | Status: DC | PRN
Start: 2021-02-01 — End: 2021-02-02

## 2021-02-01 MED ORDER — BUPIVACAINE IN DEXTROSE 0.75-8.25 % IT SOLN
INTRATHECAL | Status: DC | PRN
  Administered 2021-02-01: 1.8 mL via INTRATHECAL

## 2021-02-01 MED ORDER — PHENYLEPHRINE 40 MCG/ML (10ML) SYRINGE FOR IV PUSH (FOR BLOOD PRESSURE SUPPORT)
PREFILLED_SYRINGE | INTRAVENOUS | Status: AC
  Filled 2021-02-01: qty 10

## 2021-02-01 MED ORDER — CEFAZOLIN SODIUM-DEXTROSE 2-4 GM/100ML-% IV SOLN
2.0000 g | INTRAVENOUS | Status: AC
  Administered 2021-02-01: 2 g via INTRAVENOUS
  Filled 2021-02-01: qty 100

## 2021-02-01 MED ORDER — GABAPENTIN 300 MG PO CAPS: 300.0000 mg | ORAL_CAPSULE | Freq: Every day | ORAL | 0 refills | Status: DC

## 2021-02-01 MED ORDER — DEXAMETHASONE SODIUM PHOSPHATE 10 MG/ML IJ SOLN
INTRAMUSCULAR | Status: DC | PRN
  Administered 2021-02-01: 5 mg

## 2021-02-01 MED ORDER — MIDAZOLAM HCL 5 MG/5ML IJ SOLN
INTRAMUSCULAR | Status: DC | PRN
  Administered 2021-02-01: 2 mg via INTRAVENOUS

## 2021-02-01 MED ORDER — POLYETHYLENE GLYCOL 3350 17 G PO PACK
17.0000 g | PACK | Freq: Two times a day (BID) | ORAL | Status: DC
  Administered 2021-02-01 – 2021-02-02 (×2): 17 g via ORAL
  Filled 2021-02-01 (×2): qty 1

## 2021-02-01 MED ORDER — PROPOFOL 1000 MG/100ML IV EMUL
INTRAVENOUS | Status: AC
  Filled 2021-02-01: qty 100

## 2021-02-01 MED ORDER — PHENYLEPHRINE 40 MCG/ML (10ML) SYRINGE FOR IV PUSH (FOR BLOOD PRESSURE SUPPORT)
PREFILLED_SYRINGE | INTRAVENOUS | Status: DC | PRN
  Administered 2021-02-01: 80 ug via INTRAVENOUS

## 2021-02-01 MED ORDER — CEFAZOLIN SODIUM-DEXTROSE 2-4 GM/100ML-% IV SOLN
2.0000 g | Freq: Four times a day (QID) | INTRAVENOUS | Status: AC
Start: 2021-02-01 — End: 2021-02-02
  Administered 2021-02-01: 2 g via INTRAVENOUS
  Filled 2021-02-01: qty 100

## 2021-02-01 MED ORDER — ONDANSETRON HCL 4 MG PO TABS
4.0000 mg | ORAL_TABLET | Freq: Four times a day (QID) | ORAL | Status: DC | PRN
  Filled 2021-02-01: qty 1

## 2021-02-01 MED ORDER — POVIDONE-IODINE 7.5 % EX SOLN: Freq: Once | CUTANEOUS | Status: AC

## 2021-02-01 MED ORDER — PHENYLEPHRINE HCL-NACL 10-0.9 MG/250ML-% IV SOLN
INTRAVENOUS | Status: DC | PRN
  Administered 2021-02-01: 35 ug/min via INTRAVENOUS

## 2021-02-01 MED ORDER — ACETAMINOPHEN 500 MG PO TABS
1000.0000 mg | ORAL_TABLET | Freq: Four times a day (QID) | ORAL | Status: AC
  Administered 2021-02-01 – 2021-02-02 (×3): 1000 mg via ORAL
  Filled 2021-02-01 (×4): qty 2

## 2021-02-01 MED ORDER — POVIDONE-IODINE 10 % EX SWAB
2.0000 | Freq: Once | CUTANEOUS | Status: DC
Start: 2021-02-01 — End: 2021-02-01

## 2021-02-01 MED ORDER — 0.9 % SODIUM CHLORIDE (POUR BTL) OPTIME
TOPICAL | Status: DC | PRN
  Administered 2021-02-01: 1000 mL

## 2021-02-01 MED ORDER — PROPOFOL 500 MG/50ML IV EMUL
INTRAVENOUS | Status: AC
  Filled 2021-02-01: qty 50

## 2021-02-01 MED ORDER — PHENOL 1.4 % MT LIQD: 1.0000 | OROMUCOSAL | Status: DC | PRN

## 2021-02-01 MED ORDER — TRANEXAMIC ACID-NACL 1000-0.7 MG/100ML-% IV SOLN
1000.0000 mg | INTRAVENOUS | Status: AC
  Administered 2021-02-01: 1000 mg via INTRAVENOUS
  Filled 2021-02-01: qty 100

## 2021-02-01 MED ORDER — OXYCODONE HCL 5 MG PO TABS
10.0000 mg | ORAL_TABLET | ORAL | Status: DC | PRN
  Administered 2021-02-01: 10 mg via ORAL
  Administered 2021-02-01: 5 mg via ORAL

## 2021-02-01 MED ORDER — ROPIVACAINE HCL 7.5 MG/ML IJ SOLN
INTRAMUSCULAR | Status: DC | PRN
  Administered 2021-02-01: 20 mL via PERINEURAL

## 2021-02-01 MED ORDER — OXYCODONE HCL 5 MG PO TABS: 5.0000 mg | ORAL_TABLET | ORAL | 0 refills | Status: DC | PRN

## 2021-02-01 MED ORDER — BUPIVACAINE-EPINEPHRINE (PF) 0.25% -1:200000 IJ SOLN
INTRAMUSCULAR | Status: AC
  Filled 2021-02-01: qty 30

## 2021-02-01 MED ORDER — POTASSIUM CHLORIDE IN NACL 20-0.9 MEQ/L-% IV SOLN
INTRAVENOUS | Status: AC
  Administered 2021-02-01: 125 mL/h via INTRAVENOUS
  Filled 2021-02-01: qty 1000

## 2021-02-01 MED ORDER — SODIUM CHLORIDE (PF) 0.9 % IJ SOLN
INTRAMUSCULAR | Status: DC | PRN
  Administered 2021-02-01: 50 mL

## 2021-02-01 MED ORDER — WATER FOR IRRIGATION, STERILE IR SOLN
Status: DC | PRN
  Administered 2021-02-01: 2000 mL

## 2021-02-01 MED ORDER — ONDANSETRON HCL 4 MG/2ML IJ SOLN: 4.0000 mg | Freq: Once | INTRAMUSCULAR | Status: DC | PRN

## 2021-02-01 MED ORDER — ACETAMINOPHEN 10 MG/ML IV SOLN: 1000.0000 mg | Freq: Once | INTRAVENOUS | Status: DC | PRN

## 2021-02-01 MED ORDER — ONDANSETRON HCL 4 MG/2ML IJ SOLN
INTRAMUSCULAR | Status: AC
  Filled 2021-02-01: qty 2

## 2021-02-01 MED ORDER — OXYCODONE HCL 5 MG PO TABS
ORAL_TABLET | ORAL | Status: AC
  Filled 2021-02-01: qty 2

## 2021-02-01 MED ORDER — ONDANSETRON HCL 4 MG/2ML IJ SOLN
INTRAMUSCULAR | Status: DC | PRN
  Administered 2021-02-01: 4 mg via INTRAVENOUS

## 2021-02-01 MED ORDER — HYDROMORPHONE HCL 1 MG/ML IJ SOLN: 0.5000 mg | INTRAMUSCULAR | Status: DC | PRN

## 2021-02-01 MED ORDER — DOCUSATE SODIUM 100 MG PO CAPS: 100.0000 mg | ORAL_CAPSULE | Freq: Two times a day (BID) | ORAL | 2 refills | Status: DC

## 2021-02-01 MED ORDER — FENTANYL CITRATE (PF) 100 MCG/2ML IJ SOLN: 25.0000 ug | INTRAMUSCULAR | Status: DC | PRN

## 2021-02-01 MED ORDER — ASPIRIN EC 325 MG PO TBEC: DELAYED_RELEASE_TABLET | ORAL | 0 refills | Status: DC

## 2021-02-01 MED ORDER — LACTATED RINGERS IV BOLUS
250.0000 mL | Freq: Once | INTRAVENOUS | Status: AC
  Administered 2021-02-01: 250 mL via INTRAVENOUS

## 2021-02-01 MED ORDER — POTASSIUM CHLORIDE IN NACL 20-0.9 MEQ/L-% IV SOLN
INTRAVENOUS | Status: DC
  Filled 2021-02-01 (×2): qty 1000

## 2021-02-01 MED ORDER — ALUM & MAG HYDROXIDE-SIMETH 200-200-20 MG/5ML PO SUSP: 30.0000 mL | ORAL | Status: DC | PRN

## 2021-02-01 MED ORDER — POLYETHYLENE GLYCOL 3350 17 G PO PACK: 17.0000 g | PACK | Freq: Two times a day (BID) | ORAL | 0 refills | Status: DC

## 2021-02-01 MED ORDER — ORAL CARE MOUTH RINSE
15.0000 mL | Freq: Once | OROMUCOSAL | Status: AC
  Administered 2021-02-01: 15 mL via OROMUCOSAL

## 2021-02-01 MED ORDER — LACTATED RINGERS IV BOLUS
500.0000 mL | Freq: Once | INTRAVENOUS | Status: AC
  Administered 2021-02-01: 500 mL via INTRAVENOUS

## 2021-02-01 MED ORDER — DOCUSATE SODIUM 100 MG PO CAPS
100.0000 mg | ORAL_CAPSULE | Freq: Two times a day (BID) | ORAL | Status: DC
  Administered 2021-02-01 – 2021-02-02 (×2): 100 mg via ORAL
  Filled 2021-02-01 (×2): qty 1

## 2021-02-01 SURGICAL SUPPLY — 56 items
ATTUNE MED DOME PAT 41 KNEE (Knees) ×2 IMPLANT
ATTUNE PS FEM RT SZ 8 CEM KNEE (Femur) ×2 IMPLANT
ATTUNE PSRP INSR SZ8 6 KNEE (Insert) ×2 IMPLANT
BAG ZIPLOCK 12X15 (MISCELLANEOUS) ×2 IMPLANT
BASE TIBIAL ATTUNE KNEE SZ9 (Knees) ×1 IMPLANT
BLADE SAGITTAL 25.0X1.19X90 (BLADE) ×2 IMPLANT
BLADE SAW SGTL 13X75X1.27 (BLADE) ×4 IMPLANT
BLADE SURG SZ10 CARB STEEL (BLADE) ×4 IMPLANT
BOWL SMART MIX CTS (DISPOSABLE) ×2 IMPLANT
CEMENT HV SMART SET (Cement) ×4 IMPLANT
CHLORAPREP W/TINT 26 (MISCELLANEOUS) ×4 IMPLANT
CLSR STERI-STRIP ANTIMIC 1/2X4 (GAUZE/BANDAGES/DRESSINGS) ×2 IMPLANT
COVER SURGICAL LIGHT HANDLE (MISCELLANEOUS) ×2 IMPLANT
COVER WAND RF STERILE (DRAPES) IMPLANT
CUFF TOURN SGL QUICK 34 (TOURNIQUET CUFF) ×1
CUFF TRNQT CYL 34X4.125X (TOURNIQUET CUFF) ×1 IMPLANT
DECANTER SPIKE VIAL GLASS SM (MISCELLANEOUS) ×4 IMPLANT
DRAPE ORTHO 2.5IN SPLIT 77X108 (DRAPES) ×1 IMPLANT
DRAPE ORTHO SPLIT 77X108 STRL (DRAPES) ×1
DRAPE SHEET LG 3/4 BI-LAMINATE (DRAPES) ×2 IMPLANT
DRAPE U-SHAPE 47X51 STRL (DRAPES) ×2 IMPLANT
DRSG AQUACEL AG ADV 3.5X10 (GAUZE/BANDAGES/DRESSINGS) ×2 IMPLANT
DRSG PAD ABDOMINAL 8X10 ST (GAUZE/BANDAGES/DRESSINGS) ×2 IMPLANT
ELECT REM PT RETURN 15FT ADLT (MISCELLANEOUS) ×2 IMPLANT
GLOVE SURG ENC MOIS LTX SZ7 (GLOVE) ×2 IMPLANT
GLOVE SURG MICRO LTX SZ7.5 (GLOVE) ×2 IMPLANT
GLOVE SURG UNDER POLY LF SZ7 (GLOVE) ×2 IMPLANT
GLOVE SURG UNDER POLY LF SZ7.5 (GLOVE) ×2 IMPLANT
GOWN STRL REUS W/ TWL LRG LVL3 (GOWN DISPOSABLE) ×2 IMPLANT
GOWN STRL REUS W/TWL LRG LVL3 (GOWN DISPOSABLE) ×2
HANDPIECE INTERPULSE COAX TIP (DISPOSABLE) ×1
HOLDER FOLEY CATH W/STRAP (MISCELLANEOUS) IMPLANT
HOOD PEEL AWAY FLYTE STAYCOOL (MISCELLANEOUS) ×6 IMPLANT
KIT TURNOVER KIT A (KITS) ×2 IMPLANT
MANIFOLD NEPTUNE II (INSTRUMENTS) ×2 IMPLANT
MARKER SKIN DUAL TIP RULER LAB (MISCELLANEOUS) ×2 IMPLANT
NDL SAFETY ECLIPSE 18X1.5 (NEEDLE) ×1 IMPLANT
NEEDLE HYPO 18GX1.5 SHARP (NEEDLE) ×1
NS IRRIG 1000ML POUR BTL (IV SOLUTION) ×2 IMPLANT
PACK TOTAL KNEE CUSTOM (KITS) ×2 IMPLANT
PENCIL SMOKE EVACUATOR (MISCELLANEOUS) ×2 IMPLANT
PIN DRILL FIX HALF THREAD (BIT) ×2 IMPLANT
PIN STEINMAN FIXATION KNEE (PIN) ×2 IMPLANT
PROTECTOR NERVE ULNAR (MISCELLANEOUS) ×2 IMPLANT
SET HNDPC FAN SPRY TIP SCT (DISPOSABLE) ×1 IMPLANT
STAPLER VISISTAT 35W (STAPLE) IMPLANT
STRIP CLOSURE SKIN 1/2X4 (GAUZE/BANDAGES/DRESSINGS) ×2 IMPLANT
SUT MNCRL AB 3-0 PS2 18 (SUTURE) ×2 IMPLANT
SUT VIC AB 0 CT1 36 (SUTURE) ×2 IMPLANT
SUT VIC AB 1 CT1 36 (SUTURE) ×2 IMPLANT
SUT VIC AB 2-0 CT1 27 (SUTURE) ×6
SUT VIC AB 2-0 CT1 TAPERPNT 27 (SUTURE) ×3 IMPLANT
SYR 30ML LL (SYRINGE) ×4 IMPLANT
TIBIAL BASE ATTUNE KNEE SZ9 (Knees) ×2 IMPLANT
TRAY FOLEY MTR SLVR 14FR STAT (SET/KITS/TRAYS/PACK) ×2 IMPLANT
WATER STERILE IRR 1000ML POUR (IV SOLUTION) ×4 IMPLANT

## 2021-02-01 NOTE — Anesthesia Procedure Notes (Signed)
Anesthesia Regional Block: Adductor canal block   Pre-Anesthetic Checklist: ,, timeout performed, Correct Patient, Correct Site, Correct Laterality, Correct Procedure, Correct Position, site marked, Risks and benefits discussed,  Surgical consent,  Pre-op evaluation,  At surgeon's request and post-op pain management  Laterality: Right  Prep: Dura Prep       Needles:  Injection technique: Single-shot  Needle Type: Echogenic Stimulator Needle     Needle Length: 10cm  Needle Gauge: 20     Additional Needles:   Procedures:,,,, ultrasound used (permanent image in chart),,,,  Narrative:  Start time: 02/01/2021 6:50 AM End time: 02/01/2021 6:55 AM Injection made incrementally with aspirations every 5 mL.  Performed by: Personally  Anesthesiologist: Atilano Median, DO  Additional Notes: Patient identified. Risks/Benefits/Options discussed with patient including but not limited to bleeding, infection, nerve damage, failed block, incomplete pain control. Patient expressed understanding and wished to proceed. All questions were answered. Sterile technique was used throughout the entire procedure. Please see nursing notes for vital signs. Aspirated in 5cc intervals with injection for negative confirmation. Patient was given instructions on fall risk and not to get out of bed. All questions and concerns addressed with instructions to call with any issues or inadequate analgesia.

## 2021-02-01 NOTE — Progress Notes (Signed)
Orthopedic Tech Progress Note Patient Details:  Francisco Simmons 1951-08-16 [id number]  CPM Right Knee CPM Right Knee: On Right Knee Flexion (Degrees): 90 Right Knee Extension (Degrees): 0 Additional Comments: right  Post Interventions Patient Tolerated: Well Instructions Provided: Care of device  Saul Fordyce 02/01/2021, 9:36 AM

## 2021-02-01 NOTE — Progress Notes (Signed)
   02/01/21 1500  PT Visit Information  Last PT Received On 02/01/21  Pt continues to be orthostatic, unable to get standing BP d/t pt with incr dizziness with incr standing time. Returned pt to supine and he reported feeling better, BP improved from 97/66 (sitting) to 129/89 (supine) Given medical issues do not feel pt is safe to d/c from PT standpoint. RN aware. Will follow in acute setting  Assistance Needed +1  History of Present Illness s/p R TKA. PMH: bil CTR  Subjective Data  Patient Stated Goal home  Precautions  Precautions Fall;Knee  Restrictions  Weight Bearing Restrictions No  Other Position/Activity Restrictions WBAT  Pain Assessment  Pain Assessment 0-10  Pain Score 3  Pain Location right knee  Pain Descriptors / Indicators Sore  Pain Intervention(s) Limited activity within patient's tolerance;Monitored during session;Premedicated before session;Repositioned  Cognition  Arousal/Alertness Awake/alert  Behavior During Therapy WFL for tasks assessed/performed  Overall Cognitive Status Within Functional Limits for tasks assessed  Bed Mobility  Overal bed mobility Needs Assistance  Bed Mobility Supine to Sit;Sit to Supine  Supine to sit Min assist  Sit to supine Min assist  General bed mobility comments assist with RLE, incr time  Transfers  Overall transfer level Needs assistance  Equipment used Rolling walker (2 wheeled)  Transfers Sit to/from Stand  Sit to Stand Min assist  General transfer comment cues for hand placement and technique; dizzy on standing, unable to get BP in standing--returned pt to sitting with BP 97/66  Ambulation/Gait  General Gait Details unable d/t dizziness  Total Joint Exercises  Ankle Circles/Pumps AROM;10 reps;Both  PT - End of Session  Equipment Utilized During Treatment Gait belt  Activity Tolerance Treatment limited secondary to medical complications (Comment) (diaphoresis, low BP)  Patient left in bed;with call bell/phone within  reach  Nurse Communication Mobility status;Other (comment) (BP)   PT - Assessment/Plan  PT Plan Current plan remains appropriate  PT Visit Diagnosis Other abnormalities of gait and mobility (R26.89)  PT Frequency (ACUTE ONLY) 7X/week  Follow Up Recommendations Follow surgeon's recommendation for DC plan and follow-up therapies  PT equipment None recommended by PT  AM-PAC PT "6 Clicks" Mobility Outcome Measure (Version 2)  Help needed turning from your back to your side while in a flat bed without using bedrails? 3  Help needed moving from lying on your back to sitting on the side of a flat bed without using bedrails? 3  Help needed moving to and from a bed to a chair (including a wheelchair)? 2  Help needed standing up from a chair using your arms (e.g., wheelchair or bedside chair)? 1  Help needed to walk in hospital room? 1  Help needed climbing 3-5 steps with a railing?  1  6 Click Score 11  Consider Recommendation of Discharge To: CIR/SNF/LTACH  PT Goal Progression  Progress towards PT goals Progressing toward goals  Acute Rehab PT Goals  PT Goal Formulation With patient  Time For Goal Achievement 02/08/21  Potential to Achieve Goals Good  PT Time Calculation  PT Start Time (ACUTE ONLY) 1530  PT Stop Time (ACUTE ONLY) 1542  PT Time Calculation (min) (ACUTE ONLY) 12 min  PT General Charges  $$ ACUTE PT VISIT 1 Visit  PT Treatments  $Therapeutic Activity 8-22 mins

## 2021-02-01 NOTE — Op Note (Signed)
MRN:     [id number] DOB/AGE:    11/04/1950 / 70 y.o.       OPERATIVE REPORT   DATE OF PROCEDURE:  02/01/2021      PREOPERATIVE DIAGNOSIS:   Primary Localized Osteoarthritis right Knee       Estimated body mass index is 27.57 kg/m as calculated from the following:   Height as of this encounter: 6\' 1"  (1.854 m).   Weight as of this encounter: 94.8 kg.                                                       POSTOPERATIVE DIAGNOSIS:   Same                                                                 PROCEDURE:  Procedure(s): TOTAL KNEE ARTHROPLASTY Using Depuy Attune RP implants #8 Femur, #9Tibia, 52mm  RP bearing, 41 Patella    SURGEON: Bruno Leach A. 11m, MD   ASSISTANT: Thurston Hole, PA-C, present and scrubbed throughout the case, critical for retraction, instrumentation, and closure.  ANESTHESIA: Spinal with Adductor Nerve Block  TOURNIQUET TIME: 60 minutes   COMPLICATIONS:  None       SPECIMENS: None   INDICATIONS FOR PROCEDURE: The patient has djd of the knee with varus deformities, XR shows bone on bone arthritis. Patient has failed all conservative measures including anti-inflammatory medicines, narcotics, attempts at exercise and weight loss, cortisone injections and viscosupplementation.  Risks and benefits of surgery have been discussed, questions answered.    DESCRIPTION OF PROCEDURE: The patient identified by armband, received right adductor canal block and IV antibiotics, in the holding area at [facility]. Patient taken to the operating room, appropriate anesthetic monitors were attached. Spinal anesthesia induced with the patient in supine position, Foley catheter was inserted. Tourniquet applied high to the operative thigh. Lateral post and foot positioner applied to the table, the lower extremity was then prepped and draped in usual sterile fashion from the ankle to the tourniquet. Time-out procedure was performed. The limb was wrapped with an Esmarch bandage and  the tourniquet inflated to 365 mmHg.   We began the operation by making a 6cm anterior midline incision. Small bleeders in the skin and the subcutaneous tissue identified and cauterized. Transverse retinaculum was incised and reflected medially and a medial parapatellar arthrotomy was accomplished. the patella was everted and theprepatellar fat pad resected. The superficial medial collateral ligament was then elevated from anterior to posterior along the proximal flare of the tibia and anterior half of the menisci resected. The knee was hyperflexed exposing bone on bone arthritis. Peripheral and notch osteophytes as well as the cruciate ligaments were then resected. We continued to work our way around posteriorly along the proximal tibia, and externally rotated the tibia subluxing it out from underneath the femur. A McHale retractor was placed through the notch and a lateral Hohmann retractor placed, and an external tibial guide was placed.  The tibial cutting guide was pinned into place allowing resection of 4 mm of bone medially and about 6 mm of bone laterally because of her varus  deformity.   Satisfied with the tibial resection, we then entered the distal femur 2 mm anterior to the PCL origin with the intramedullary guide rod and applied the distal femoral cutting guide set at 64mm, with 5 degrees of valgus. This was pinned along the epicondylar axis. At this point, the distal femoral cut was accomplished without difficulty. We then sized for a 8 femoral component and pinned the guide in 3 degrees of external rotation.The chamfer cutting guide was pinned into place. The anterior, posterior, and chamfer cuts were accomplished without difficulty followed by the  RP box cutting guide and the box cut. We also removed posterior osteophytes from the posterior femoral condyles. At this time, the knee was brought into full extension. We checked our extension and flexion gaps and found them symmetric at 6.  The  patella thickness measured at 65m m. We set the cutting guide at 20 and removed the posterior patella sized for 41 button and drilled the lollipop. The knee was then once again hyperflexed exposing the proximal tibia. We sized for a # 9 tibial base plate, applied the smokestack and the conical reamer followed by the the Delta fin keel punch. We then hammered into place the  RP trial femoral component, inserted a trial bearing, trial patellar button, and took the knee through range of motion from 0-130 degrees. No thumb pressure was required for patellar tracking.   At this point, all trial components were removed, a double batch of DePuy HV cement with Zinacef 1.5 gms  was mixed and applied to all bony metallic mating surfaces. In order, we hammered into place the tibial tray and removed excess cement, the femoral component and removed excess cement, a 6 mm  RP bearing was inserted, and the knee brought to full extension with compression. The patellar button was clamped into place, and excess cement removed. While the cement cured the wound was irrigated out with normal saline solution pulse lavage, and exparel was injected throughout the knee. Ligament stability and patellar tracking were checked and found to be excellent..   The parapatellar arthrotomy was closed with  #1 Vicryl suture. The subcutaneous tissue with 0 and 2-0 undyed Vicryl suture, and 4-0 Monocryl.. A dressing of Aquaseal, 4 x 4, dressing sponges, Webril, and Ace wrap applied. Needle and sponge count were correct times 2.The patient awakened, extubated, and taken to recovery room without difficulty. Vascular status was normal, pulses 2+ and symmetric.    [name] 01/15/2018, 8:56 AM

## 2021-02-01 NOTE — Anesthesia Postprocedure Evaluation (Signed)
Anesthesia Post Note  Patient: Francisco Simmons.  Procedure(s) Performed: TOTAL KNEE ARTHROPLASTY (Right Knee)     Patient location during evaluation: PACU Anesthesia Type: Regional, MAC and Spinal Level of consciousness: awake and alert Pain management: pain level controlled Vital Signs Assessment: post-procedure vital signs reviewed and stable Respiratory status: spontaneous breathing, nonlabored ventilation, respiratory function stable and patient connected to nasal cannula oxygen Cardiovascular status: stable and blood pressure returned to baseline Postop Assessment: no apparent nausea or vomiting Anesthetic complications: no   No complications documented.  Last Vitals:  Vitals:   02/01/21 1104 02/01/21 1200  BP: 133/89 (!) 143/97  Pulse: 70 83  Resp: 14 15  Temp: 36.5 C   SpO2: 93% 96%    Last Pain:  Vitals:   02/01/21 1200  TempSrc:   PainSc: 5                  Danaria Larsen P Vickie Melnik

## 2021-02-01 NOTE — Anesthesia Procedure Notes (Signed)
Spinal  Patient location during procedure: OR Start time: 02/01/2021 7:13 AM End time: 02/01/2021 7:15 AM Staffing Performed: anesthesiologist  Anesthesiologist: Atilano Median, DO Preanesthetic Checklist Completed: patient identified, IV checked, site marked, risks and benefits discussed, surgical consent, monitors and equipment checked, pre-op evaluation and timeout performed Spinal Block Patient position: sitting Prep: DuraPrep Patient monitoring: heart rate, cardiac monitor, continuous pulse ox and blood pressure Approach: midline Location: L4-5 Injection technique: single-shot Needle Needle type: Pencan  Needle gauge: 24 G Needle length: 10 cm Assessment Events: CSF return Additional Notes Patient identified. Risks/Benefits/Options discussed with patient including but not limited to bleeding, infection, nerve damage, paralysis, failed block, incomplete pain control, headache, blood pressure changes, nausea, vomiting, reactions to medications, itching and postpartum back pain. Confirmed with bedside nurse the patient's most recent platelet count. Confirmed with patient that they are not currently taking any anticoagulation, have any bleeding history or any family history of bleeding disorders. Patient expressed understanding and wished to proceed. All questions were answered. Sterile technique was used throughout the entire procedure. Please see nursing notes for vital signs. Warning signs of high block given to the patient including shortness of breath, tingling/numbness in hands, complete motor block, or any concerning symptoms with instructions to call for help. Patient was given instructions on fall risk and not to get out of bed. All questions and concerns addressed with instructions to call with any issues or inadequate analgesia.

## 2021-02-01 NOTE — Interval H&P Note (Signed)
History and Physical Interval Note:  02/01/2021 6:14 AM  Francisco Simmons.  has presented today for surgery, with the diagnosis of djd right knee.  The various methods of treatment have been discussed with the patient and family. After consideration of risks, benefits and other options for treatment, the patient has consented to  Procedure(s): TOTAL KNEE ARTHROPLASTY (Right) as a surgical intervention.  The patient's history has been reviewed, patient examined, no change in status, stable for surgery.  I have reviewed the patient's chart and labs.  Questions were answered to the patient's satisfaction.     [name]

## 2021-02-01 NOTE — Transfer of Care (Signed)
Immediate Anesthesia Transfer of Care Note  Patient: Francisco Simmons.  Procedure(s) Performed: TOTAL KNEE ARTHROPLASTY (Right Knee)  Patient Location: PACU  Anesthesia Type:Spinal  Level of Consciousness: awake, alert  and oriented  Airway & Oxygen Therapy: Patient Spontanous Breathing and Patient connected to face mask oxygen  Post-op Assessment: Report given to RN and Post -op Vital signs reviewed and stable  Post vital signs: Reviewed and stable  Last Vitals:  Vitals Value Taken Time  BP 112/82 02/01/21 0916  Temp    Pulse 66 02/01/21 0918  Resp 14 02/01/21 0918  SpO2 100 % 02/01/21 0918  Vitals shown include unvalidated device data.  Last Pain:  Vitals:   02/01/21 0551  TempSrc: Oral         Complications: No complications documented.

## 2021-02-01 NOTE — Progress Notes (Signed)
Orthopedic Tech Progress Note Patient Details:  Francisco Simmons [birth year] [id number]  CPM Right Knee CPM Right Knee: On Right Knee Flexion (Degrees): 40 Right Knee Extension (Degrees): 10 Additional Comments: right  Post Interventions Patient Tolerated: Well Instructions Provided: Care of device  Saul Fordyce 02/01/2021, 4:35 PM

## 2021-02-01 NOTE — Progress Notes (Signed)
Orthopedic Tech Progress Note Patient Details:  Francisco Simmons 02/08/1951 [id number]  CPM Right Knee CPM Right Knee: On Right Knee Flexion (Degrees): 90 Right Knee Extension (Degrees): 0 Additional Comments: right  Post Interventions Patient Tolerated: Well Instructions Provided: Care of device  Saul Fordyce 02/01/2021, 4:34 PM

## 2021-02-01 NOTE — Progress Notes (Signed)
Orthopedic Tech Progress Note Patient Details:  Francisco Simmons [birth year] [id number]  Patient ID: Francisco Harder., male   DOB: [birth year], 70 y.o.   MRN: [id number]   Francisco Simmons 02/01/2021, 9:58 AMPatient going home D.C.  CPM.

## 2021-02-01 NOTE — Progress Notes (Signed)
Pt decided he only wanted to take 5 mg of oxycodone for pain, as 10 mg "made him loopy" in PACU. Two tablets had already been opened at bedside. One wasted in pyxis with Cephus Shelling RN as witness.

## 2021-02-01 NOTE — Evaluation (Signed)
Physical Therapy Evaluation Patient Details Name: Francisco Simmons. MRN: [id number] DOB: [birth year] Today's Date: 02/01/2021   History of Present Illness  s/p R TKA. PMH: bil CTR  Clinical Impression  Pt is s/p TKA resulting in the deficits listed below (see PT Problem List).  Pt movign well to get to EOB only requiring min assist with RLE. Dizzy while sitting EOB, mildly diaphoretic--with BP 82/61 (128/79 in supine). RN made aware. Will attempt to see pt later and progress mobility as able.  Pt will benefit from skilled PT to increase their independence and safety with mobility to allow discharge to the venue listed below.      Follow Up Recommendations Follow surgeon's recommendation for DC plan and follow-up therapies    Equipment Recommendations  None recommended by PT    Recommendations for Other Services       Precautions / Restrictions Precautions Precautions: Fall;Knee Restrictions Weight Bearing Restrictions: No Other Position/Activity Restrictions: WBAT      Mobility  Bed Mobility Overal bed mobility: Needs Assistance Bed Mobility: Sit to Supine;Supine to Sit     Supine to sit: Min assist Sit to supine: Min assist   General bed mobility comments: assist with RLE; mildly dizzy/diaphoretic on sitting with BP 82/61    Transfers                    Ambulation/Gait                Stairs            Wheelchair Mobility    Modified Rankin (Stroke Patients Only)       Balance                                             Pertinent Vitals/Pain Pain Assessment: 0-10 Pain Score: 4  Pain Location: right knee Pain Descriptors / Indicators: Sore Pain Intervention(s): Premedicated before session;Monitored during session;Limited activity within patient's tolerance    Home Living Family/patient expects to be discharged to:: Private residence Living Arrangements: Spouse/significant other Available Help at Discharge:  Family;Available 24 hours/day Type of Home: House Home Access: Stairs to enter Entrance Stairs-Rails: Lawyer of Steps: 3 Home Layout: One level Home Equipment: Environmental consultant - 2 wheels      Prior Function Level of Independence: Independent               Hand Dominance        Extremity/Trunk Assessment   Upper Extremity Assessment Upper Extremity Assessment: Overall WFL for tasks assessed    Lower Extremity Assessment Lower Extremity Assessment: RLE deficits/detail RLE Deficits / Details: ankle WFL. knee extension ~ 2+/5  ~15 degree quad lag, hip flexion 3/5.       Communication   Communication: No difficulties  Cognition Arousal/Alertness: Awake/alert Behavior During Therapy: WFL for tasks assessed/performed Overall Cognitive Status: Within Functional Limits for tasks assessed                                        General Comments      Exercises Total Joint Exercises Ankle Circles/Pumps: AROM;10 reps;Both Quad Sets: 5 reps;Both;AROM Heel Slides: AROM;AAROM;Right;5 reps   Assessment/Plan    PT Assessment Patient needs continued PT services  PT Problem List Decreased strength;Decreased  mobility;Decreased range of motion;Decreased activity tolerance;Decreased balance;Decreased knowledge of use of DME;Pain;Decreased knowledge of precautions       PT Treatment Interventions Stair training;Functional mobility training;Gait training;Therapeutic exercise;Patient/family education;DME instruction;Therapeutic activities    PT Goals (Current goals can be found in the Care Plan section)  Acute Rehab PT Goals Patient Stated Goal: home PT Goal Formulation: With patient Time For Goal Achievement: 02/08/21 Potential to Achieve Goals: Good    Frequency 7X/week   Barriers to discharge        Co-evaluation               AM-PAC PT "6 Clicks" Mobility  Outcome Measure Help needed turning from your back to your side  while in a flat bed without using bedrails?: A Little Help needed moving from lying on your back to sitting on the side of a flat bed without using bedrails?: A Little Help needed moving to and from a bed to a chair (including a wheelchair)?: A Lot Help needed standing up from a chair using your arms (e.g., wheelchair or bedside chair)?: A Lot Help needed to walk in hospital room?: Total Help needed climbing 3-5 steps with a railing? : Total 6 Click Score: 12    End of Session Equipment Utilized During Treatment: Gait belt Activity Tolerance: Treatment limited secondary to medical complications (Comment) (low BP 82/61) Patient left: in bed;with call bell/phone within reach Nurse Communication: Mobility status;Other (comment) (BP) PT Visit Diagnosis: Other abnormalities of gait and mobility (R26.89)    Time: 1173-5670 PT Time Calculation (min) (ACUTE ONLY): 17 min   Charges:   PT Evaluation $PT Eval Low Complexity: 1 Low          Christia Coaxum, PT  Acute Rehab Dept ([facility]) [phone] Pager [phone]  02/01/2021   [facility] 02/01/2021, 1:37 PM

## 2021-02-01 NOTE — Anesthesia Procedure Notes (Signed)
Procedure Name: MAC Date/Time: 02/01/2021 7:07 AM Performed by: Maxwell Caul, CRNA Pre-anesthesia Checklist: Patient identified, Emergency Drugs available, Suction available and Patient being monitored Oxygen Delivery Method: Simple face mask

## 2021-02-02 ENCOUNTER — Encounter (HOSPITAL_COMMUNITY): Payer: Self-pay | Admitting: Orthopedic Surgery

## 2021-02-02 DIAGNOSIS — Z7982 Long term (current) use of aspirin: Secondary | ICD-10-CM | POA: Diagnosis not present

## 2021-02-02 DIAGNOSIS — Z87891 Personal history of nicotine dependence: Secondary | ICD-10-CM | POA: Diagnosis not present

## 2021-02-02 DIAGNOSIS — N400 Enlarged prostate without lower urinary tract symptoms: Secondary | ICD-10-CM | POA: Diagnosis not present

## 2021-02-02 DIAGNOSIS — M1711 Unilateral primary osteoarthritis, right knee: Secondary | ICD-10-CM | POA: Diagnosis not present

## 2021-02-02 DIAGNOSIS — Z96651 Presence of right artificial knee joint: Secondary | ICD-10-CM | POA: Diagnosis not present

## 2021-02-02 DIAGNOSIS — Z79899 Other long term (current) drug therapy: Secondary | ICD-10-CM | POA: Diagnosis not present

## 2021-02-02 DIAGNOSIS — I10 Essential (primary) hypertension: Secondary | ICD-10-CM | POA: Diagnosis not present

## 2021-02-02 NOTE — TOC Transition Note (Signed)
Transition of Care [facility]) - CM/SW Discharge Note  Patient Details  Name: Francisco Simmons. MRN: [id number] Date of Birth: June 22, 1951  Transition of [facility]) CM/SW Contact:  Sherie Don, LCSW Phone Number: 02/02/2021, 9:13 AM  Clinical Narrative: Patient expected to discharge home today. CSW met with patient to review discharge plan. Patient will discharge home with OPPT through [facility]. and received his rolling walker from [place]. CPM to be delivered to patient when he discharges home. TOC signing off.  Final next level of care: OP Rehab Barriers to Discharge: No Barriers Identified  Patient Goals and CMS Choice Patient states their goals for this hospitalization and ongoing recovery are:: Discharge home with OPPT through [place]. CMS Medicare.gov Compare Post Acute Care list provided to:: Patient Choice offered to / list presented to : Patient  Discharge Plan and Services        DME Arranged: CPM,Walker rolling DME Agency: [employer] Representative spoke with at DME Agency: Pre-arranged in orthopedist's office  Readmission Risk Interventions No flowsheet data found.

## 2021-02-02 NOTE — Progress Notes (Signed)
Patient discharged to home w/ wife. Given all belongings, instructions, equipment. Verbalized understanding of instructions. Escorted to pov via w/c. 

## 2021-02-02 NOTE — Discharge Summary (Signed)
Patient ID: Janalyn Harder. MRN: [id number] DOB/AGE: 71-Jul-1952 71 y.o.  Admit date: 02/01/2021 Discharge date: 02/02/2021  Admission Diagnoses:  Principal Problem:   Primary localized osteoarthritis of right knee Active Problems:   Hypertension   Primary localized osteoarthritis of left knee   Benign prostatic hyperplasia   Discharge Diagnoses:  Same  Past Medical History:  Diagnosis Date  . BPH (benign prostatic hypertrophy)   . Complication of anesthesia    Hard to wake up after carpal tunnel surgery  . GERD (gastroesophageal reflux disease)   . Hypertension   . Primary localized osteoarthritis of left knee   . Primary localized osteoarthritis of right knee 01/19/2021  . Wears glasses     Surgeries: Procedure(s): TOTAL KNEE ARTHROPLASTY on 02/01/2021   Consultants:   Discharged Condition: Improved  Hospital Course: Edwen Mclester. is an 70 y.o. male who was admitted 02/01/2021 for operative treatment ofPrimary localized osteoarthritis of right knee. Patient has severe unremitting pain that affects sleep, daily activities, and work/hobbies. After pre-op clearance the patient was taken to the operating room on 02/01/2021 and underwent  Procedure(s): TOTAL KNEE ARTHROPLASTY.    Patient was given perioperative antibiotics:  Anti-infectives (From admission, onward)   Start     Dose/Rate Route Frequency Ordered Stop   02/01/21 1315  ceFAZolin (ANCEF) IVPB 2g/100 mL premix        2 g 200 mL/hr over 30 Minutes Intravenous Every 6 hours 02/01/21 0932 02/02/21 0114   02/01/21 0748  cefUROXime (ZINACEF) 1.5 g in sodium chloride 0.9 % 100 mL IVPB  Status:  Discontinued        over 30 Minutes  Continuous PRN 02/01/21 0749 02/01/21 0932   02/01/21 0600  ceFAZolin (ANCEF) IVPB 2g/100 mL premix        2 g 200 mL/hr over 30 Minutes Intravenous On call to O.R. 02/01/21 4098 02/01/21 0731       Patient was given sequential compression devices, early ambulation, and  chemoprophylaxis to prevent DVT.  Patient benefited maximally from hospital stay and there were no complications.    Recent vital signs:  Patient Vitals for the past 24 hrs:  BP Temp Temp src Pulse Resp SpO2 Height Weight  02/02/21 0514 135/78 99.5 F (37.5 C) Oral 80 18 92 % -- --  02/02/21 0211 132/76 98.6 F (37 C) Oral 86 16 92 % -- --  02/01/21 2100 133/82 98.3 F (36.8 C) Oral 89 16 96 % -- --  02/01/21 1933 135/89 98.5 F (36.9 C) Oral 91 16 93 % -- --  02/01/21 1732 (!) 140/95 98.5 F (36.9 C) Oral 95 16 97 %  (1.854 m) 96.6 kg  02/01/21 1608 (!) 143/91 98 F (36.7 C) -- 74 16 97 % -- --  02/01/21 1536 129/89 -- -- 74 16 97 % -- --  02/01/21 1533 97/67 -- -- 72 16 96 % -- --  02/01/21 1500 133/84 97.9 F (36.6 C) -- 72 16 -- -- --  02/01/21 1400 132/86 98 F (36.7 C) -- 72 16 96 % -- --  02/01/21 1315 (!) 82/61 -- -- 74 -- -- -- --  02/01/21 1300 128/87 97.8 F (36.6 C) -- 72 16 93 % -- --  02/01/21 1236 -- -- -- 79 -- 96 % -- --  02/01/21 1200 (!) 143/97 -- -- 83 15 96 % -- --  02/01/21 1104 133/89 97.7 F (36.5 C) -- 70 14 93 % -- --  02/01/21  1045 118/81 98 F (36.7 C) -- 68 11 98 % -- --  02/01/21 1030 119/74 -- -- 69 18 91 % -- --  02/01/21 1015 120/82 -- -- 62 15 92 % -- --  02/01/21 1000 113/82 -- -- 67 14 95 % -- --  02/01/21 0945 111/84 -- -- 70 14 96 % -- --  02/01/21 0930 109/75 -- -- 64 (!) 22 100 % -- --  02/01/21 0916 112/82 (!) 97.5 F (36.4 C) -- 68 17 100 % -- --     Recent laboratory studies: No results for input(s): WBC, HGB, HCT, PLT, NA, K, CL, CO2, BUN, CREATININE, GLUCOSE, INR, CALCIUM in the last 72 hours.  Invalid input(s): PT, 2   Discharge Medications:   Allergies as of 02/02/2021   No Known Allergies     Medication List    STOP taking these medications   aspirin 81 MG tablet Replaced by: aspirin EC 325 MG tablet     TAKE these medications   amLODipine 10 MG tablet Commonly known as: NORVASC Take 10 mg by mouth  daily.   aspirin EC 325 MG tablet 1 tablet a day for 30 days to prevent blood clots Replaces: aspirin 81 MG tablet   atorvastatin 20 MG tablet Commonly known as: LIPITOR Take 20 mg by mouth at bedtime.   celecoxib 200 MG capsule Commonly known as: CELEBREX Take 200 mg by mouth daily as needed for moderate pain (knee).   docusate sodium 100 MG capsule Commonly known as: Colace Take 1 capsule (100 mg total) by mouth 2 (two) times daily.   doxazosin 2 MG tablet Commonly known as: CARDURA Take 2 mg by mouth every evening.   gabapentin 300 MG capsule Commonly known as: Neurontin Take 1 capsule (300 mg total) by mouth at bedtime. For nerve pain   losartan 100 MG tablet Commonly known as: COZAAR Take 100 mg by mouth daily.   oxyCODONE 5 MG immediate release tablet Commonly known as: Oxy IR/ROXICODONE Take 1 tablet (5 mg total) by mouth every 4 (four) hours as needed for severe pain.   polyethylene glycol 17 g packet Commonly known as: MiraLax Take 17 g by mouth 2 (two) times daily. 17 grams in 6 oz of favorite drink twice a day until bowel movement.  LAXITIVE.  Restart if two days since last bowel movement            Discharge Care Instructions  (From admission, onward)         Start     Ordered   02/02/21 0000  Change dressing       Comments: DO NOT REMOVE BANDAGE OVER SURGICAL INCISION.  WASH WHOLE LEG INCLUDING OVER THE WATERPROOF BANDAGE WITH SOAP AND WATER EVERY DAY.   02/02/21 0907          Diagnostic Studies: No results found.  Disposition: Discharge disposition: 01-Home or Self Care       Discharge Instructions    CPM   Complete by: As directed    Continuous passive motion machine (CPM):      Use the CPM from 0 to 90 for 6 hours per day.       You may break it up into 2 or 3 sessions per day.      Use CPM for 2 weeks or until you are told to stop.   Call MD / Call 911   Complete by: As directed    If you experience chest pain or shortness  of  breath, CALL 911 and be transported to the hospital emergency room.  If you develope a fever above 101 F, pus (white drainage) or increased drainage or redness at the wound, or calf pain, call your surgeon's office.   Change dressing   Complete by: As directed    DO NOT REMOVE BANDAGE OVER SURGICAL INCISION.  WASH WHOLE LEG INCLUDING OVER THE WATERPROOF BANDAGE WITH SOAP AND WATER EVERY DAY.   Constipation Prevention   Complete by: As directed    Drink plenty of fluids.  Prune juice may be helpful.  You may use a stool softener, such as Colace (over the counter) 100 mg twice a day.  Use MiraLax (over the counter) for constipation as needed.   Diet - low sodium heart healthy   Complete by: As directed    Discharge instructions   Complete by: As directed    INSTRUCTIONS AFTER JOINT REPLACEMENT   Remove items at home which could result in a fall. This includes throw rugs or furniture in walking pathways You may notice swelling that will progress down to the foot and ankle.  This is normal after surgery.  Elevate your leg when you are not up walking on it.   Continue to use the breathing machine you got in the hospital (incentive spirometer) which will help keep your temperature down.  It is common for your temperature to cycle up and down following surgery, especially at night when you are not up moving around and exerting yourself.  The breathing machine keeps your lungs expanded and your temperature down.   DIET:  As you were doing prior to hospitalization, we recommend a well-balanced diet.  DRESSING / WOUND CARE / SHOWERING  Keep the surgical dressing until follow up.  The dressing is water proof, so you can shower without any extra covering.  IF THE DRESSING FALLS OFF or the wound gets wet inside, change the dressing with sterile gauze.  Please use good hand washing techniques before changing the dressing.  Do not use any lotions or creams on the incision until instructed by your surgeon.     ACTIVITY  Increase activity slowly as tolerated, but follow the weight bearing instructions below.   No driving for 4 weeks or until further direction given by your physician.  You cannot drive while taking narcotics.  No lifting or carrying greater than 10 lbs. until further directed by your surgeon. Avoid periods of inactivity such as sitting longer than an hour when not asleep. This helps prevent blood clots.  You may return to work once you are authorized by your doctor.     WEIGHT BEARING   Weight bearing as tolerated with assist device (walker, cane, etc) as directed, use it as long as suggested by your surgeon or therapist, typically at least 1-2 weeks.   EXERCISES  Results after joint replacement surgery are often greatly improved when you follow the exercise, range of motion and muscle strengthening exercises prescribed by your doctor. Safety measures are also important to protect the joint from further injury. Any time any of these exercises cause you to have increased pain or swelling, decrease what you are doing until you are comfortable again and then slowly increase them. If you have problems or questions, call your caregiver or physical therapist for advice.   Rehabilitation is important following a joint replacement. After just a few days of immobilization, the muscles of the leg can become weakened and shrink (atrophy).  These exercises are designed  to build up the tone and strength of the thigh and leg muscles and to improve motion. Often times heat used for twenty to thirty minutes before working out will loosen up your tissues and help with improving the range of motion but do not use heat for the first two weeks following surgery (sometimes heat can increase post-operative swelling).   These exercises can be done on a training (exercise) mat, on the floor, on a table or on a bed. Use whatever works the best and is most comfortable for you.    Use music or television  while you are exercising so that the exercises are a pleasant break in your day. This will make your life better with the exercises acting as a break in your routine that you can look forward to.   Perform all exercises about fifteen times, three times per day or as directed.  You should exercise both the operative leg and the other leg as well.  Exercises include:   Quad Sets - Tighten up the muscle on the front of the thigh (Quad) and hold for 5-10 seconds.   Straight Leg Raises - With your knee straight (if you were given a brace, keep it on), lift the leg to 60 degrees, hold for 3 seconds, and slowly lower the leg.  Perform this exercise against resistance later as your leg gets stronger.  Leg Slides: Lying on your back, slowly slide your foot toward your buttocks, bending your knee up off the floor (only go as far as is comfortable). Then slowly slide your foot back down until your leg is flat on the floor again.  Angel Wings: Lying on your back spread your legs to the side as far apart as you can without causing discomfort.  Hamstring Strength:  Lying on your back, push your heel against the floor with your leg straight by tightening up the muscles of your buttocks.  Repeat, but this time bend your knee to a comfortable angle, and push your heel against the floor.  You may put a pillow under the heel to make it more comfortable if necessary.   A rehabilitation program following joint replacement surgery can speed recovery and prevent re-injury in the future due to weakened muscles. Contact your doctor or a physical therapist for more information on knee rehabilitation.    CONSTIPATION  Constipation is defined medically as fewer than three stools per week and severe constipation as less than one stool per week.  Even if you have a regular bowel pattern at home, your normal regimen is likely to be disrupted due to multiple reasons following surgery.  Combination of anesthesia, postoperative  narcotics, change in appetite and fluid intake all can affect your bowels.   YOU MUST use at least one of the following options; they are listed in order of increasing strength to get the job done.  They are all available over the counter, and you may need to use some, POSSIBLY even all of these options:    Drink plenty of fluids (prune juice may be helpful) and high fiber foods Colace 100 mg by mouth twice a day  Senokot for constipation as directed and as needed Dulcolax (bisacodyl), take with full glass of water  Miralax (polyethylene glycol) twice a day until first bowel movement  If you have tried all these things and are unable to have a bowel movement in the first 3-4 days after surgery call either your surgeon or your primary doctor.  legs until for at least 2 weeks or as directed by physician office. They may be removed at night for sleeping.  MEDICATIONS:  See your medication summary on the "After Visit Summary" that nursing will review with you.  You may have some home medications which will be placed on hold until you complete the course of blood thinner medication.  It is important for you to complete the blood thinner medication as prescribed.  PRECAUTIONS:  If you experience chest pain or shortness of breath - call 911 immediately for transfer to the hospital emergency department.   If you develop a fever greater that 101 F, purulent drainage from wound, increased redness or drainage from wound, foul odor from the wound/dressing, or calf pain - CONTACT YOUR SURGEON.                                                   FOLLOW-UP APPOINTMENTS:  If you do not already have a post-op appointment, please call the office for an appointment to be seen by your surgeon.  Guidelines for how soon to be seen are listed in your "After Visit Summary", but are typically between 1-4 weeks after surgery.  OTHER INSTRUCTIONS:   Knee Replacement:  Do not place pillow  under knee, focus on keeping the knee straight while resting. CPM instructions: 0-90 degrees, 2 hours in the morning, 2 hours in the afternoon, and 2 hours in the evening. Place foam block, curve side up under heel at all times except when in CPM or when walking.  DO NOT modify, tear, cut, or change the foam block in any way.  POST-OPERATIVE OPIOID TAPER INSTRUCTIONS: It is important to wean off of your opioid medication as soon as possible. If you do not need pain medication after your surgery it is ok to stop day one. Opioids include: Codeine, Hydrocodone(Norco, Vicodin), Oxycodone(Percocet, oxycontin) and hydromorphone amongst others.  Long term and even short term use of opiods can cause: Increased pain response Dependence Constipation Depression Respiratory depression And more.  Withdrawal symptoms can include Flu like symptoms Nausea, vomiting And more Techniques to manage these symptoms Hydrate well Eat regular healthy meals Stay active Use relaxation techniques(deep breathing, meditating, yoga) Do Not substitute Alcohol to help with tapering If you have been on opioids for less than two weeks and do not have pain than it is ok to stop all together.  Plan to wean off of opioids This plan should start within one week post op of your joint replacement. Maintain the same interval or time between taking each dose and first decrease the dose.  Cut the total daily intake of opioids by one tablet each day Next start to increase the time between doses. The last dose that should be eliminated is the evening dose.   MAKE SURE YOU:  Understand these instructions.  Get help right away if you are not doing well or get worse.    Thank you for letting us be a part of your medical care team.  It is a privilege we respect greatly.  We hope these instructions will help you stay on track for a fast and full recovery!        start to increase the time between doses. The last dose that should be eliminated is the evening dose.     MAKE SURE YOU:  Understand these instructions.  Get help right away if you are not doing well or get worse.    Thank you for letting us be a part of your medical care team.  It is a privilege we respect greatly.  We hope these instructions will help you stay on track for a fast and full recovery!   Do not put a pillow under the knee. Place it under the heel.   Complete by: As directed    Place gray foam block, curve side up under heel at all times except when in CPM or when walking.  DO NOT modify, tear, cut, or change in any way the gray foam block.   Increase activity slowly as tolerated   Complete by: As directed    Patient may shower   Complete by: As directed    Aquacel dressing is water proof    Wash over it and the whole leg with soap and water at the end of your shower   TED hose   Complete by: As directed    Use stockings (TED hose) for 2 weeks on both leg(s).  You may remove them at night for sleeping.       Follow-up Information    Salvatore Marvel, MD. Go on 02/09/2021.   Specialty: Orthopedic Surgery Why: Your appointment has been scheduled for 11:15.  Contact information: [address] [place] [address] [phone]        [facility], [place]. Go on 02/03/2021.   Why: Your outpatient physical therapy appointment is at 10:00. Please arrive at 9:45 to complete your paperwork  Contact information: [facility] [address] [place] [address] [phone]              Patient was suppose to discharge yesterday same day as surgery.  He was kept overnight due to orthostatic hypotension.  Today he walk the whole floor of [address] west and did a flight of stairs     He is being discharged in  stable condition with therapy starting tomorrow  Signed: Pascal Lux 02/02/2021, 9:11 AM

## 2021-02-02 NOTE — Progress Notes (Addendum)
   02/02/21 1200  PT Visit Information  Last PT Received On 02/02/21 Reviewed safety with transfers/amb with pt and wife, verbally reviewed stairs however pt practiced with PA earlier so deferred a second stair training. Reviewed use of gait belt as needed.  Reviewed no pillow under knee as well as use of bone foam with pt and spouse. Ready for d/c with family assist  From PT standpoint.  Assistance Needed +1  History of Present Illness s/p R TKA. PMH: bil CTR  Subjective Data  Patient Stated Goal home  Precautions  Precautions Fall;Knee  Restrictions  Weight Bearing Restrictions No  Other Position/Activity Restrictions WBAT  Pain Assessment  Pain Assessment 0-10  Pain Score 4  Pain Location right knee  Pain Descriptors / Indicators Sore  Pain Intervention(s) Limited activity within patient's tolerance;Monitored during session;Premedicated before session;Repositioned  Cognition  Arousal/Alertness Awake/alert  Behavior During Therapy WFL for tasks assessed/performed  Overall Cognitive Status Within Functional Limits for tasks assessed  Bed Mobility  Overal bed mobility Modified Independent  Bed Mobility Supine to Sit  General bed mobility comments able to self assist RLE with gait belt  Transfers  Overall transfer level Needs assistance  Equipment used Rolling walker (2 wheeled)  Transfers Sit to/from Stand  Sit to Stand Min guard;Supervision  General transfer comment cues for hand placement, RLE positioning  Ambulation/Gait  Ambulation/Gait assistance Min guard;Supervision  Gait Distance (Feet) 140 Feet  Assistive device Rolling walker (2 wheeled)  Gait Pattern/deviations Step-to pattern;Step-through pattern;Decreased stride length  General Gait Details cues for gait progression, heel strike and   R knee extension in stance.  Gait velocity decr  PT - End of Session  Equipment Utilized During Treatment Gait belt  Activity Tolerance Patient tolerated treatment well  Patient  left in bed;with call bell/phone within reach;with bed alarm set   PT - Assessment/Plan  PT Plan Current plan remains appropriate  PT Visit Diagnosis Other abnormalities of gait and mobility (R26.89)  PT Frequency (ACUTE ONLY) 7X/week  Follow Up Recommendations Follow surgeon's recommendation for DC plan and follow-up therapies  PT equipment None recommended by PT  AM-PAC PT "6 Clicks" Mobility Outcome Measure (Version 2)  Help needed turning from your back to your side while in a flat bed without using bedrails? 3  Help needed moving from lying on your back to sitting on the side of a flat bed without using bedrails? 3  Help needed moving to and from a bed to a chair (including a wheelchair)? 3  Help needed standing up from a chair using your arms (e.g., wheelchair or bedside chair)? 3  Help needed to walk in hospital room? 3  Help needed climbing 3-5 steps with a railing?  3  6 Click Score 18  Consider Recommendation of Discharge To: Home with [facility]  PT Goal Progression  Progress towards PT goals Progressing toward goals  Acute Rehab PT Goals  PT Goal Formulation With patient  Time For Goal Achievement 02/08/21  Potential to Achieve Goals Good  PT Time Calculation  PT Start Time (ACUTE ONLY) 1150  PT Stop Time (ACUTE ONLY) 1202  PT Time Calculation (min) (ACUTE ONLY) 12 min  PT General Charges  $$ ACUTE PT VISIT 1 Visit  PT Treatments  $Gait Training 8-22 mins

## 2021-02-02 NOTE — Progress Notes (Signed)
Physical Therapy Treatment Patient Details Name: Francisco Simmons. MRN: [id number] DOB: [birth year] Today's Date: 02/02/2021    History of Present Illness s/p R TKA. PMH: bil CTR    PT Comments    Pt amb and did stairs with Ortho PA earlier. therefore deferred further amb at this time d/t incr pain. Will see for family education once wife is here  Follow Up Recommendations  Follow surgeon's recommendation for DC plan and follow-up therapies     Equipment Recommendations  None recommended by PT    Recommendations for Other Services       Precautions / Restrictions Precautions Precautions: Fall;Knee Restrictions Weight Bearing Restrictions: No Other Position/Activity Restrictions: WBAT    Mobility  Bed Mobility                    Transfers                    Ambulation/Gait                 Stairs             Wheelchair Mobility    Modified Rankin (Stroke Patients Only)       Balance                                            Cognition Arousal/Alertness: Awake/alert Behavior During Therapy: WFL for tasks assessed/performed Overall Cognitive Status: Within Functional Limits for tasks assessed                                        Exercises Total Joint Exercises Ankle Circles/Pumps: AROM;10 reps;Both Quad Sets: AROM;Both;10 reps Heel Slides: AAROM;Right;10 reps Hip ABduction/ADduction: AROM;Right;10 reps Straight Leg Raises: AROM;Right;5 reps Goniometric ROM: grossly 5 to 60 degrees    General Comments        Pertinent Vitals/Pain Pain Assessment: 0-10 Pain Score: 6  Pain Location: right knee Pain Descriptors / Indicators: Sore Pain Intervention(s): Limited activity within patient's tolerance;Monitored during session;Premedicated before session;Repositioned    Home Living                      Prior Function            PT Goals (current goals can now be found in  the care plan section) Acute Rehab PT Goals Patient Stated Goal: home PT Goal Formulation: With patient Time For Goal Achievement: 02/08/21 Potential to Achieve Goals: Good Progress towards PT goals: Progressing toward goals    Frequency    7X/week      PT Plan Current plan remains appropriate    Co-evaluation              AM-PAC PT "6 Clicks" Mobility   Outcome Measure  Help needed turning from your back to your side while in a flat bed without using bedrails?: A Little Help needed moving from lying on your back to sitting on the side of a flat bed without using bedrails?: A Little Help needed moving to and from a bed to a chair (including a wheelchair)?: A Little Help needed standing up from a chair using your arms (e.g., wheelchair or bedside chair)?: A Little Help needed to walk in hospital room?: A Little Help needed  climbing 3-5 steps with a railing? : A Little 6 Click Score: 18    End of Session Equipment Utilized During Treatment: Gait belt Activity Tolerance: Patient tolerated treatment well Patient left: in bed;with call bell/phone within reach;with bed alarm set   PT Visit Diagnosis: Other abnormalities of gait and mobility (R26.89)     Time: 0109-3235 PT Time Calculation (min) (ACUTE ONLY): 12 min  Charges:  $Therapeutic Exercise: 8-22 mins                     Delice Bison, PT  Acute Rehab Dept ([facility]) [phone] Pager [phone]  02/02/2021    [facility] 02/02/2021, 10:45 AM

## 2021-02-03 DIAGNOSIS — M25661 Stiffness of right knee, not elsewhere classified: Secondary | ICD-10-CM | POA: Diagnosis not present

## 2021-02-03 DIAGNOSIS — M25561 Pain in right knee: Secondary | ICD-10-CM | POA: Diagnosis not present

## 2021-02-03 DIAGNOSIS — R262 Difficulty in walking, not elsewhere classified: Secondary | ICD-10-CM | POA: Diagnosis not present

## 2021-02-03 DIAGNOSIS — M6281 Muscle weakness (generalized): Secondary | ICD-10-CM | POA: Diagnosis not present

## 2021-02-06 ENCOUNTER — Emergency Department (HOSPITAL_COMMUNITY)
Admission: EM | Admit: 2021-02-06 | Discharge: 2021-02-06 | Disposition: A | Discharge status: Home / Self Care | Payer: Medicare HMO | Attending: Emergency Medicine | Admitting: Emergency Medicine

## 2021-02-06 DIAGNOSIS — R42 Dizziness and giddiness: Secondary | ICD-10-CM | POA: Diagnosis not present

## 2021-02-06 DIAGNOSIS — Z96651 Presence of right artificial knee joint: Secondary | ICD-10-CM | POA: Diagnosis not present

## 2021-02-06 DIAGNOSIS — R609 Edema, unspecified: Secondary | ICD-10-CM | POA: Diagnosis not present

## 2021-02-06 DIAGNOSIS — R61 Generalized hyperhidrosis: Secondary | ICD-10-CM | POA: Diagnosis not present

## 2021-02-06 DIAGNOSIS — R55 Syncope and collapse: Secondary | ICD-10-CM | POA: Diagnosis not present

## 2021-02-06 DIAGNOSIS — E876 Hypokalemia: Secondary | ICD-10-CM | POA: Diagnosis not present

## 2021-02-06 DIAGNOSIS — Z7982 Long term (current) use of aspirin: Secondary | ICD-10-CM | POA: Diagnosis not present

## 2021-02-06 DIAGNOSIS — I10 Essential (primary) hypertension: Secondary | ICD-10-CM | POA: Diagnosis not present

## 2021-02-06 DIAGNOSIS — Z87891 Personal history of nicotine dependence: Secondary | ICD-10-CM | POA: Insufficient documentation

## 2021-02-06 DIAGNOSIS — Z79899 Other long term (current) drug therapy: Secondary | ICD-10-CM | POA: Insufficient documentation

## 2021-02-06 LAB — CBC WITH DIFFERENTIAL/PLATELET
Abs Immature Granulocytes: 0.08 10*3/uL — ABNORMAL HIGH (ref 0.00–0.07)
Basophils Absolute: 0 10*3/uL (ref 0.0–0.1)
Basophils Relative: 0 %
Eosinophils Absolute: 0.2 10*3/uL (ref 0.0–0.5)
Eosinophils Relative: 2 %
HCT: 36.1 % — ABNORMAL LOW (ref 39.0–52.0)
Hemoglobin: 12.1 g/dL — ABNORMAL LOW (ref 13.0–17.0)
Immature Granulocytes: 1 %
Lymphocytes Relative: 10 %
Lymphs Abs: 1 10*3/uL (ref 0.7–4.0)
MCH: 30.4 pg (ref 26.0–34.0)
MCHC: 33.5 g/dL (ref 30.0–36.0)
MCV: 90.7 fL (ref 80.0–100.0)
Monocytes Absolute: 1.3 10*3/uL — ABNORMAL HIGH (ref 0.1–1.0)
Monocytes Relative: 13 %
Neutro Abs: 7.2 10*3/uL (ref 1.7–7.7)
Neutrophils Relative %: 74 %
Platelets: 255 10*3/uL (ref 150–400)
RBC: 3.98 MIL/uL — ABNORMAL LOW (ref 4.22–5.81)
RDW: 13.1 % (ref 11.5–15.5)
WBC: 9.8 10*3/uL (ref 4.0–10.5)
nRBC: 0 % (ref 0.0–0.2)

## 2021-02-06 LAB — BASIC METABOLIC PANEL
Anion gap: 9 (ref 5–15)
BUN: 13 mg/dL (ref 8–23)
CO2: 27 mmol/L (ref 22–32)
Calcium: 8.1 mg/dL — ABNORMAL LOW (ref 8.9–10.3)
Chloride: 103 mmol/L (ref 98–111)
Creatinine, Ser: 1.18 mg/dL (ref 0.61–1.24)
GFR, Estimated: >60 mL/min (ref >60)
Glucose, Bld: 131 mg/dL — ABNORMAL HIGH (ref 70–99)
Potassium: 2.9 mmol/L — ABNORMAL LOW (ref 3.5–5.1)
Sodium: 139 mmol/L (ref 135–145)

## 2021-02-06 LAB — CBG MONITORING, ED: Glucose-Capillary: 122 mg/dL — ABNORMAL HIGH (ref 70–99)

## 2021-02-06 MED ORDER — CALCIUM CARBONATE ANTACID 500 MG PO CHEW
2.0000 | CHEWABLE_TABLET | Freq: Once | ORAL | Status: AC
  Administered 2021-02-06: 400 mg via ORAL
  Filled 2021-02-06: qty 2

## 2021-02-06 MED ORDER — SODIUM CHLORIDE 0.9 % IV BOLUS
1000.0000 mL | Freq: Once | INTRAVENOUS | Status: AC
  Administered 2021-02-06: 1000 mL via INTRAVENOUS

## 2021-02-06 MED ORDER — POTASSIUM CHLORIDE CRYS ER 20 MEQ PO TBCR
40.0000 meq | EXTENDED_RELEASE_TABLET | Freq: Once | ORAL | Status: AC
  Administered 2021-02-06: 40 meq via ORAL
  Filled 2021-02-06: qty 2

## 2021-02-06 NOTE — ED Provider Notes (Signed)
[facility]-EMERGENCY DEPT Provider Note   CSN: [id number] Arrival date & time: 02/06/21  1219     History Chief Complaint  Patient presents with  . Near Syncope    Francisco Simmons. is a 70 y.o. male.  He had a knee replacement about 5 days ago.  He stayed overnight in the hospital and was discharged the next day.  He has been taking oxycodone as needed since then.  Today at the breakfast table he took his blood pressure medicine and a pain pill.  He began to feel lightheaded and diaphoretic and had a near syncopal event.  His wife said he did not fall off the chair and she held him on.  No bowel or bladder incontinence.  He said he still feels a little weak but otherwise at baseline.  No chest pain or shortness of breath.  The history is provided by the patient.  Near Syncope This is a new problem. The current episode started 3 to 5 hours ago. The problem has been resolved. Pertinent negatives include no chest pain, no abdominal pain, no headaches and no shortness of breath. Nothing aggravates the symptoms. Nothing relieves the symptoms. He has tried rest for the symptoms. The treatment provided significant relief.       Past Medical History:  Diagnosis Date  . BPH (benign prostatic hypertrophy)   . Complication of anesthesia    Hard to wake up after carpal tunnel surgery  . GERD (gastroesophageal reflux disease)   . Hypertension   . Primary localized osteoarthritis of left knee   . Primary localized osteoarthritis of right knee 01/19/2021  . Wears glasses     Patient Active Problem List   Diagnosis Date Noted  . Primary localized osteoarthritis of right knee 01/19/2021  . Hypertension   . Primary localized osteoarthritis of left knee   . Benign prostatic hyperplasia     Past Surgical History:  Procedure Laterality Date  . CARPAL TUNNEL RELEASE  2003   right  . CARPAL TUNNEL RELEASE Left 04/15/2014   Procedure: LEFT CARPAL TUNNEL RELEASE;   Surgeon: Wyn Forster, MD;  Location: [facility];  Service: Orthopedics;  Laterality: Left;  . COLONOSCOPY    . INGUINAL HERNIA REPAIR  1992   left  . KNEE ARTHROSCOPY  1990   right and left  . TOTAL KNEE ARTHROPLASTY Right 02/01/2021   Procedure: TOTAL KNEE ARTHROPLASTY;  Surgeon: Salvatore Marvel, MD;  Location: [facility];  Service: Orthopedics;  Laterality: Right;       Family History  Problem Relation Age of Onset  . Hypertension Mother   . Lung cancer Father     Social History   Tobacco Use  . Smoking status: Former Smoker    Quit date: 04/14/1984    Years since quitting: 36.8  . Smokeless tobacco: Never Used  Vaping Use  . Vaping Use: Never used  Substance Use Topics  . Alcohol use: Yes    Comment: Rare  . Drug use: No    Home Medications Prior to Admission medications   Medication Sig Start Date End Date Taking? Authorizing Provider  amLODipine (NORVASC) 10 MG tablet Take 10 mg by mouth daily.    [provider]  aspirin EC 325 MG tablet 1 tablet a day for 30 days to prevent blood clots 02/01/21   Shepperson, Kirstin, PA-C  atorvastatin (LIPITOR) 20 MG tablet Take 20 mg by mouth at bedtime. 12/22/20   [provider]  celecoxib (CELEBREX) 200 MG capsule Take 200 mg by mouth daily as needed for moderate pain (knee). 10/23/20   [provider]  docusate sodium (COLACE) 100 MG capsule Take 1 capsule (100 mg total) by mouth 2 (two) times daily. 02/01/21 02/01/22  Shepperson, Kirstin, PA-C  doxazosin (CARDURA) 2 MG tablet Take 2 mg by mouth every evening.    [provider]  gabapentin (NEURONTIN) 300 MG capsule Take 1 capsule (300 mg total) by mouth at bedtime. For nerve pain 02/01/21 03/03/21  Shepperson, Kirstin, PA-C  losartan (COZAAR) 100 MG tablet Take 100 mg by mouth daily.    [provider]  oxyCODONE (OXY IR/ROXICODONE) 5 MG immediate release tablet Take 1 tablet (5 mg total) by mouth every 4 (four) hours as  needed for severe pain. 02/01/21   Shepperson, Kirstin, PA-C  polyethylene glycol (MIRALAX) 17 g packet Take 17 g by mouth 2 (two) times daily. 17 grams in 6 oz of favorite drink twice a day until bowel movement.  LAXITIVE.  Restart if two days since last bowel movement 02/01/21   Shepperson, Kirstin, PA-C    Allergies    Patient has no known allergies.  Review of Systems   Review of Systems  Constitutional: Positive for diaphoresis and fatigue. Negative for fever.  HENT: Negative for sore throat.   Eyes: Negative for visual disturbance.  Respiratory: Negative for shortness of breath.   Cardiovascular: Positive for near-syncope. Negative for chest pain.  Gastrointestinal: Negative for abdominal pain.  Genitourinary: Negative for dysuria.  Musculoskeletal: Negative for neck pain.  Skin: Negative for rash.  Neurological: Negative for headaches.    Physical Exam Updated Vital Signs BP 115/84 (BP Location: Left Arm)   Pulse 72   Temp 99 F (37.2 C) (Oral)   Resp 16   Ht 6\' 1"  (1.854 m)   Wt 96.6 kg   SpO2 98%   BMI 28.10 kg/m   Physical Exam Vitals and nursing note reviewed.  Constitutional:      Appearance: Normal appearance. He is well-developed.  HENT:     Head: Normocephalic and atraumatic.  Eyes:     Conjunctiva/sclera: Conjunctivae normal.  Cardiovascular:     Rate and Rhythm: Normal rate and regular rhythm.     Heart sounds: No murmur heard.   Pulmonary:     Effort: Pulmonary effort is normal. No respiratory distress.     Breath sounds: Normal breath sounds.  Abdominal:     Palpations: Abdomen is soft.     Tenderness: There is no abdominal tenderness.  Musculoskeletal:     Cervical back: Neck supple.     Comments: He has a bandage over his right knee with some swelling.  No cords.  Skin:    General: Skin is warm and dry.  Neurological:     General: No focal deficit present.     Mental Status: He is alert.     ED Results / Procedures / Treatments    Labs (all labs ordered are listed, but only abnormal results are displayed) Labs Reviewed  BASIC METABOLIC PANEL - Abnormal; Notable for the following components:      Result Value   Potassium 2.9 (*)    Glucose, Bld 131 (*)    Calcium 8.1 (*)    All other components within normal limits  CBC WITH DIFFERENTIAL/PLATELET - Abnormal; Notable for the following components:   RBC 3.98 (*)    Hemoglobin 12.1 (*)    HCT 36.1 (*)  Monocytes Absolute 1.3 (*)    Abs Immature Granulocytes 0.08 (*)    All other components within normal limits  CBG MONITORING, ED - Abnormal; Notable for the following components:   Glucose-Capillary 122 (*)    All other components within normal limits    EKG EKG Interpretation  Date/Time:  Saturday February 06 2021 14:30:11 EDT Ventricular Rate:  72 PR Interval:  204 QRS Duration: 90 QT Interval:  414 QTC Calculation: 453 R Axis:   -5 Text Interpretation: Sinus rhythm with Premature atrial complexes Minimal voltage criteria for LVH, may be normal variant ( R in aVL ) Borderline ECG Confirmed by Meridee Score [phone]) on 02/06/2021 2:34:25 PM   Radiology No results found.  Procedures Procedures   Medications Ordered in ED Medications  sodium chloride 0.9 % bolus 1,000 mL (has no administration in time range)    ED Course  I have reviewed the triage vital signs and the nursing notes.  Pertinent labs & imaging results that were available during my care of the patient were reviewed by me and considered in my medical decision making (see chart for details).  Clinical Course as of 02/07/21 0914  Sat Feb 06, 2021  1340 Work-up has been fairly unremarkable.  Hemoglobin little bit lower than his preop.  Potassium low at 2.9 and calcium low at 8.1.  It looks like he runs low on both of these at baseline.  Have ordered oral supplementations of both.  We will check orthostatics. [MB]  1602 Patient has been asymptomatic here.  Tolerating p.o.  [MB]     Clinical Course User Index [MB] Terrilee Files, MD   MDM Rules/Calculators/A&P                         This patient complains of near syncope or syncope; this involves an extensive number of treatment Options and is a complaint that carries with it a high risk of complications and Morbidity. The differential includes syncope, vasovagal, hypovolemia, overmedication, PE  I ordered, reviewed and interpreted labs, which included CBC with normal white count, hemoglobin slightly lower than baseline postoperatively, potassium low and orally repleted, chemistries otherwise unremarkable I ordered medication IV fluids I ordered imaging studies which included chest x-ray and I independently    visualized and interpreted imaging which showed no acute findings Additional history obtained from patient and spouse Previous records obtained and reviewed in epic including prior op note  After the interventions stated above, I reevaluated the patient and found patient to be asymptomatic tolerating p.o.  No indications for admission.  Patient comfortable plan for outpatient follow-up with his primary care doctor.  Return instructions discussed   Final Clinical Impression(s) / ED Diagnoses Final diagnoses:  Near syncope  Hypokalemia  Hypocalcemia    Rx / DC Orders ED Discharge Orders    None       Terrilee Files, MD 02/07/21 [phone]

## 2021-02-06 NOTE — Discharge Instructions (Signed)
You were seen in the emergency department for a syncopal event.  Your lab work showed your potassium and calcium to be somewhat low and you are given oral supplements.  Please stay well-hydrated.  Return to the emergency department for any worsening or concerning symptoms.

## 2021-02-06 NOTE — ED Notes (Signed)
Patient did not want to stand for 3 minutes during orthostatic vital signs because he recently had knee surgery.

## 2021-02-06 NOTE — ED Notes (Signed)
Called pts wife, will be on the way in a few minutes

## 2021-02-06 NOTE — ED Triage Notes (Signed)
Pt BIB EMS and coming from home.  Pt was sitting at his kitchen table and felt dizzy and clammy and then had a near syncopal episode.  On EMS arrival, pt remained a/o x 4.  EMS's EKG was unremarkable with occasional PAC.  EMS VS: BP: 110/76, 98% RA, HR: 70, RR: 18 CBG: 180

## 2021-02-09 DIAGNOSIS — M6281 Muscle weakness (generalized): Secondary | ICD-10-CM | POA: Diagnosis not present

## 2021-02-09 DIAGNOSIS — M25661 Stiffness of right knee, not elsewhere classified: Secondary | ICD-10-CM | POA: Diagnosis not present

## 2021-02-09 DIAGNOSIS — M25561 Pain in right knee: Secondary | ICD-10-CM | POA: Diagnosis not present

## 2021-02-09 DIAGNOSIS — R262 Difficulty in walking, not elsewhere classified: Secondary | ICD-10-CM | POA: Diagnosis not present

## 2021-02-11 DIAGNOSIS — M25561 Pain in right knee: Secondary | ICD-10-CM | POA: Diagnosis not present

## 2021-02-11 DIAGNOSIS — R262 Difficulty in walking, not elsewhere classified: Secondary | ICD-10-CM | POA: Diagnosis not present

## 2021-02-11 DIAGNOSIS — M25661 Stiffness of right knee, not elsewhere classified: Secondary | ICD-10-CM | POA: Diagnosis not present

## 2021-02-11 DIAGNOSIS — M6281 Muscle weakness (generalized): Secondary | ICD-10-CM | POA: Diagnosis not present

## 2021-02-15 DIAGNOSIS — M6281 Muscle weakness (generalized): Secondary | ICD-10-CM | POA: Diagnosis not present

## 2021-02-15 DIAGNOSIS — M25661 Stiffness of right knee, not elsewhere classified: Secondary | ICD-10-CM | POA: Diagnosis not present

## 2021-02-15 DIAGNOSIS — R262 Difficulty in walking, not elsewhere classified: Secondary | ICD-10-CM | POA: Diagnosis not present

## 2021-02-15 DIAGNOSIS — M1711 Unilateral primary osteoarthritis, right knee: Secondary | ICD-10-CM | POA: Diagnosis not present

## 2021-02-18 DIAGNOSIS — M1711 Unilateral primary osteoarthritis, right knee: Secondary | ICD-10-CM | POA: Diagnosis not present

## 2021-02-18 DIAGNOSIS — R262 Difficulty in walking, not elsewhere classified: Secondary | ICD-10-CM | POA: Diagnosis not present

## 2021-02-18 DIAGNOSIS — M25661 Stiffness of right knee, not elsewhere classified: Secondary | ICD-10-CM | POA: Diagnosis not present

## 2021-02-18 DIAGNOSIS — M6281 Muscle weakness (generalized): Secondary | ICD-10-CM | POA: Diagnosis not present

## 2021-02-23 DIAGNOSIS — M25661 Stiffness of right knee, not elsewhere classified: Secondary | ICD-10-CM | POA: Diagnosis not present

## 2021-02-23 DIAGNOSIS — M6281 Muscle weakness (generalized): Secondary | ICD-10-CM | POA: Diagnosis not present

## 2021-02-23 DIAGNOSIS — M25561 Pain in right knee: Secondary | ICD-10-CM | POA: Diagnosis not present

## 2021-02-23 DIAGNOSIS — R262 Difficulty in walking, not elsewhere classified: Secondary | ICD-10-CM | POA: Diagnosis not present

## 2021-02-25 DIAGNOSIS — R262 Difficulty in walking, not elsewhere classified: Secondary | ICD-10-CM | POA: Diagnosis not present

## 2021-02-25 DIAGNOSIS — M6281 Muscle weakness (generalized): Secondary | ICD-10-CM | POA: Diagnosis not present

## 2021-02-25 DIAGNOSIS — M25661 Stiffness of right knee, not elsewhere classified: Secondary | ICD-10-CM | POA: Diagnosis not present

## 2021-02-25 DIAGNOSIS — M25561 Pain in right knee: Secondary | ICD-10-CM | POA: Diagnosis not present

## 2021-03-02 DIAGNOSIS — M25661 Stiffness of right knee, not elsewhere classified: Secondary | ICD-10-CM | POA: Diagnosis not present

## 2021-03-02 DIAGNOSIS — M1711 Unilateral primary osteoarthritis, right knee: Secondary | ICD-10-CM | POA: Diagnosis not present

## 2021-03-02 DIAGNOSIS — M6281 Muscle weakness (generalized): Secondary | ICD-10-CM | POA: Diagnosis not present

## 2021-03-02 DIAGNOSIS — R262 Difficulty in walking, not elsewhere classified: Secondary | ICD-10-CM | POA: Diagnosis not present

## 2021-03-04 DIAGNOSIS — R262 Difficulty in walking, not elsewhere classified: Secondary | ICD-10-CM | POA: Diagnosis not present

## 2021-03-04 DIAGNOSIS — M25661 Stiffness of right knee, not elsewhere classified: Secondary | ICD-10-CM | POA: Diagnosis not present

## 2021-03-04 DIAGNOSIS — M25561 Pain in right knee: Secondary | ICD-10-CM | POA: Diagnosis not present

## 2021-03-04 DIAGNOSIS — M6281 Muscle weakness (generalized): Secondary | ICD-10-CM | POA: Diagnosis not present

## 2021-03-08 DIAGNOSIS — E785 Hyperlipidemia, unspecified: Secondary | ICD-10-CM | POA: Diagnosis not present

## 2021-03-08 DIAGNOSIS — I10 Essential (primary) hypertension: Secondary | ICD-10-CM | POA: Diagnosis not present

## 2021-03-08 DIAGNOSIS — N189 Chronic kidney disease, unspecified: Secondary | ICD-10-CM | POA: Diagnosis not present

## 2021-03-08 DIAGNOSIS — R7303 Prediabetes: Secondary | ICD-10-CM | POA: Diagnosis not present

## 2021-03-08 DIAGNOSIS — N400 Enlarged prostate without lower urinary tract symptoms: Secondary | ICD-10-CM | POA: Diagnosis not present

## 2021-03-09 DIAGNOSIS — R262 Difficulty in walking, not elsewhere classified: Secondary | ICD-10-CM | POA: Diagnosis not present

## 2021-03-09 DIAGNOSIS — M6281 Muscle weakness (generalized): Secondary | ICD-10-CM | POA: Diagnosis not present

## 2021-03-09 DIAGNOSIS — M25561 Pain in right knee: Secondary | ICD-10-CM | POA: Diagnosis not present

## 2021-03-09 DIAGNOSIS — M25661 Stiffness of right knee, not elsewhere classified: Secondary | ICD-10-CM | POA: Diagnosis not present

## 2021-03-11 DIAGNOSIS — M25561 Pain in right knee: Secondary | ICD-10-CM | POA: Diagnosis not present

## 2021-03-11 DIAGNOSIS — R262 Difficulty in walking, not elsewhere classified: Secondary | ICD-10-CM | POA: Diagnosis not present

## 2021-03-11 DIAGNOSIS — M25661 Stiffness of right knee, not elsewhere classified: Secondary | ICD-10-CM | POA: Diagnosis not present

## 2021-03-11 DIAGNOSIS — M6281 Muscle weakness (generalized): Secondary | ICD-10-CM | POA: Diagnosis not present

## 2021-03-16 DIAGNOSIS — R262 Difficulty in walking, not elsewhere classified: Secondary | ICD-10-CM | POA: Diagnosis not present

## 2021-03-16 DIAGNOSIS — M25561 Pain in right knee: Secondary | ICD-10-CM | POA: Diagnosis not present

## 2021-03-16 DIAGNOSIS — M6281 Muscle weakness (generalized): Secondary | ICD-10-CM | POA: Diagnosis not present

## 2021-03-16 DIAGNOSIS — M25661 Stiffness of right knee, not elsewhere classified: Secondary | ICD-10-CM | POA: Diagnosis not present

## 2021-03-18 DIAGNOSIS — R262 Difficulty in walking, not elsewhere classified: Secondary | ICD-10-CM | POA: Diagnosis not present

## 2021-03-18 DIAGNOSIS — M25561 Pain in right knee: Secondary | ICD-10-CM | POA: Diagnosis not present

## 2021-03-18 DIAGNOSIS — M6281 Muscle weakness (generalized): Secondary | ICD-10-CM | POA: Diagnosis not present

## 2021-03-18 DIAGNOSIS — M25661 Stiffness of right knee, not elsewhere classified: Secondary | ICD-10-CM | POA: Diagnosis not present

## 2021-03-23 DIAGNOSIS — M25561 Pain in right knee: Secondary | ICD-10-CM | POA: Diagnosis not present

## 2021-03-23 DIAGNOSIS — R262 Difficulty in walking, not elsewhere classified: Secondary | ICD-10-CM | POA: Diagnosis not present

## 2021-03-23 DIAGNOSIS — M6281 Muscle weakness (generalized): Secondary | ICD-10-CM | POA: Diagnosis not present

## 2021-03-23 DIAGNOSIS — M25661 Stiffness of right knee, not elsewhere classified: Secondary | ICD-10-CM | POA: Diagnosis not present

## 2021-03-24 DIAGNOSIS — R972 Elevated prostate specific antigen [PSA]: Secondary | ICD-10-CM | POA: Diagnosis not present

## 2021-03-25 DIAGNOSIS — R262 Difficulty in walking, not elsewhere classified: Secondary | ICD-10-CM | POA: Diagnosis not present

## 2021-03-25 DIAGNOSIS — M25561 Pain in right knee: Secondary | ICD-10-CM | POA: Diagnosis not present

## 2021-03-25 DIAGNOSIS — M6281 Muscle weakness (generalized): Secondary | ICD-10-CM | POA: Diagnosis not present

## 2021-03-25 DIAGNOSIS — M25661 Stiffness of right knee, not elsewhere classified: Secondary | ICD-10-CM | POA: Diagnosis not present

## 2021-04-02 ENCOUNTER — Encounter (HOSPITAL_COMMUNITY): Payer: Self-pay | Admitting: Emergency Medicine

## 2021-04-02 ENCOUNTER — Ambulatory Visit (HOSPITAL_COMMUNITY)
Admission: EM | Admit: 2021-04-02 | Discharge: 2021-04-02 | Disposition: A | Discharge status: Home / Self Care | Payer: Medicare HMO | Attending: Urgent Care | Admitting: Urgent Care

## 2021-04-02 ENCOUNTER — Other Ambulatory Visit: Payer: Self-pay

## 2021-04-02 DIAGNOSIS — R0981 Nasal congestion: Secondary | ICD-10-CM

## 2021-04-02 DIAGNOSIS — R059 Cough, unspecified: Secondary | ICD-10-CM

## 2021-04-02 DIAGNOSIS — U071 COVID-19: Secondary | ICD-10-CM | POA: Diagnosis not present

## 2021-04-02 MED ORDER — PROMETHAZINE-DM 6.25-15 MG/5ML PO SYRP: 5.0000 mL | ORAL_SOLUTION | Freq: Every evening | ORAL | 0 refills | Status: DC | PRN

## 2021-04-02 MED ORDER — NIRMATRELVIR/RITONAVIR (PAXLOVID) TABLET (RENAL DOSING): 2.0000 | ORAL_TABLET | Freq: Two times a day (BID) | ORAL | 0 refills | Status: AC

## 2021-04-02 MED ORDER — BENZONATATE 100 MG PO CAPS: 100.0000 mg | ORAL_CAPSULE | Freq: Three times a day (TID) | ORAL | 0 refills | Status: DC | PRN

## 2021-04-02 MED ORDER — CETIRIZINE HCL 5 MG PO TABS: 5.0000 mg | ORAL_TABLET | Freq: Every day | ORAL | 0 refills | Status: DC

## 2021-04-02 NOTE — ED Triage Notes (Signed)
Pt is present today with a cough, nasal congestion, body aches, and fever. Pt states that his sx started Monday and persistent through the week. Pt states that he tested positive for covid this past wed.

## 2021-04-02 NOTE — ED Provider Notes (Signed)
[facility]   MRN: [id number] DOB: 1951-02-24  Subjective:   Francisco Umholtz. is a 70 y.o. male presenting for 4-day history of acute onset cough, sinus congestion, body aches and fever.  Patient went and had a COVID-19 test done on Wednesday at [facility], demonstrated this and I saw the result was positive.  Since Wednesday his symptoms have improved but would like antiviral treatment.  Denies chest pain, shortness of breath, wheezing, ongoing fevers.  He is not a smoker.  Last GFR was in April and was greater than 60.  No current facility-administered medications for this encounter.  Current Outpatient Medications:    amLODipine (NORVASC) 10 MG tablet, Take 10 mg by mouth daily., Disp: , Rfl:    aspirin EC 325 MG tablet, 1 tablet a day for 30 days to prevent blood clots, Disp: 30 tablet, Rfl: 0   aspirin EC 81 MG tablet, Take 81 mg by mouth daily. Swallow whole., Disp: , Rfl:    atorvastatin (LIPITOR) 20 MG tablet, Take 20 mg by mouth at bedtime., Disp: , Rfl:    docusate sodium (COLACE) 100 MG capsule, Take 1 capsule (100 mg total) by mouth 2 (two) times daily. (Patient taking differently: Take 100 mg by mouth 2 (two) times daily as needed for mild constipation.), Disp: 60 capsule, Rfl: 2   doxazosin (CARDURA) 2 MG tablet, Take 2 mg by mouth every evening., Disp: , Rfl:    gabapentin (NEURONTIN) 300 MG capsule, Take 1 capsule (300 mg total) by mouth at bedtime. For nerve pain, Disp: 30 capsule, Rfl: 0   losartan (COZAAR) 100 MG tablet, Take 100 mg by mouth daily., Disp: , Rfl:    oxyCODONE (OXY IR/ROXICODONE) 5 MG immediate release tablet, Take 1 tablet (5 mg total) by mouth every 4 (four) hours as needed for severe pain., Disp: 42 tablet, Rfl: 0   polyethylene glycol (MIRALAX) 17 g packet, Take 17 g by mouth 2 (two) times daily. 17 grams in 6 oz of favorite drink twice a day until bowel movement.  LAXITIVE.  Restart if two days since last bowel movement (Patient taking  differently: Take 17 g by mouth daily as needed for mild constipation.), Disp: 14 packet, Rfl: 0   No Known Allergies  Past Medical History:  Diagnosis Date   BPH (benign prostatic hypertrophy)    Complication of anesthesia    Hard to wake up after carpal tunnel surgery   GERD (gastroesophageal reflux disease)    Hypertension    Primary localized osteoarthritis of left knee    Primary localized osteoarthritis of right knee 01/19/2021   Wears glasses      Past Surgical History:  Procedure Laterality Date   CARPAL TUNNEL RELEASE  2003   right   CARPAL TUNNEL RELEASE Left 04/15/2014   Procedure: LEFT CARPAL TUNNEL RELEASE;  Surgeon: Wyn Forster, MD;  Location: [facility];  Service: Orthopedics;  Laterality: Left;   COLONOSCOPY     INGUINAL HERNIA REPAIR  1992   left   KNEE ARTHROSCOPY  1990   right and left   TOTAL KNEE ARTHROPLASTY Right 02/01/2021   Procedure: TOTAL KNEE ARTHROPLASTY;  Surgeon: Salvatore Marvel, MD;  Location: [facility];  Service: Orthopedics;  Laterality: Right;    Family History  Problem Relation Age of Onset   Hypertension Mother    Lung cancer Father     Social History   Tobacco Use   Smoking status: Former  Pack years: 0.00    Types: Cigarettes    Quit date: 04/14/1984    Years since quitting: 36.9   Smokeless tobacco: Never  Vaping Use   Vaping Use: Never used  Substance Use Topics   Alcohol use: Yes    Comment: Rare   Drug use: No    ROS   Objective:   Vitals: BP (!) 137/91   Pulse 78   Temp 98.9 F (37.2 C)   Resp 18   SpO2 98%   Physical Exam Constitutional:      General: He is not in acute distress.    Appearance: Normal appearance. He is well-developed. He is not ill-appearing, toxic-appearing or diaphoretic.  HENT:     Head: Normocephalic and atraumatic.     Right Ear: External ear normal.     Left Ear: External ear normal.     Nose: Nose normal.     Mouth/Throat:     Mouth: Mucous membranes are  moist.     Pharynx: Oropharynx is clear.  Eyes:     General: No scleral icterus.    Extraocular Movements: Extraocular movements intact.     Pupils: Pupils are equal, round, and reactive to light.  Cardiovascular:     Rate and Rhythm: Normal rate and regular rhythm.     Heart sounds: Normal heart sounds. No murmur heard.   No friction rub. No gallop.  Pulmonary:     Effort: Pulmonary effort is normal. No respiratory distress.     Breath sounds: Normal breath sounds. No stridor. No wheezing, rhonchi or rales.  Neurological:     Mental Status: He is alert and oriented to person, place, and time.  Psychiatric:        Mood and Affect: Mood normal.        Behavior: Behavior normal.        Thought Content: Thought content normal.      Assessment and Plan :   PDMP not reviewed this encounter.  1. COVID-19   2. Cough   3. Nasal congestion     Provided with prescription for Paxlovid, meets criteria given his latest GFR.  Also offered other supportive care. Counseled patient on potential for adverse effects with medications prescribed/recommended today, ER and return-to-clinic precautions discussed, patient verbalized understanding.    Wallis Bamberg, [place] 04/02/21 1307

## 2021-06-07 DIAGNOSIS — N189 Chronic kidney disease, unspecified: Secondary | ICD-10-CM | POA: Diagnosis not present

## 2021-06-07 DIAGNOSIS — R7303 Prediabetes: Secondary | ICD-10-CM | POA: Diagnosis not present

## 2021-06-07 DIAGNOSIS — I10 Essential (primary) hypertension: Secondary | ICD-10-CM | POA: Diagnosis not present

## 2021-06-07 DIAGNOSIS — E785 Hyperlipidemia, unspecified: Secondary | ICD-10-CM | POA: Diagnosis not present

## 2021-06-07 DIAGNOSIS — N400 Enlarged prostate without lower urinary tract symptoms: Secondary | ICD-10-CM | POA: Diagnosis not present

## 2021-06-10 DIAGNOSIS — M25562 Pain in left knee: Secondary | ICD-10-CM | POA: Diagnosis not present

## 2021-06-10 DIAGNOSIS — M7061 Trochanteric bursitis, right hip: Secondary | ICD-10-CM | POA: Diagnosis not present

## 2021-06-10 DIAGNOSIS — M1711 Unilateral primary osteoarthritis, right knee: Secondary | ICD-10-CM | POA: Diagnosis not present

## 2021-06-11 DIAGNOSIS — I10 Essential (primary) hypertension: Secondary | ICD-10-CM | POA: Diagnosis not present

## 2021-06-11 DIAGNOSIS — E785 Hyperlipidemia, unspecified: Secondary | ICD-10-CM | POA: Diagnosis not present

## 2021-06-11 DIAGNOSIS — R7303 Prediabetes: Secondary | ICD-10-CM | POA: Diagnosis not present

## 2021-06-24 DIAGNOSIS — E119 Type 2 diabetes mellitus without complications: Secondary | ICD-10-CM | POA: Diagnosis not present

## 2021-06-24 DIAGNOSIS — R972 Elevated prostate specific antigen [PSA]: Secondary | ICD-10-CM | POA: Diagnosis not present

## 2021-06-24 DIAGNOSIS — N5201 Erectile dysfunction due to arterial insufficiency: Secondary | ICD-10-CM | POA: Diagnosis not present

## 2021-06-24 DIAGNOSIS — N401 Enlarged prostate with lower urinary tract symptoms: Secondary | ICD-10-CM | POA: Diagnosis not present

## 2021-06-24 DIAGNOSIS — R3912 Poor urinary stream: Secondary | ICD-10-CM | POA: Diagnosis not present

## 2021-07-05 NOTE — Progress Notes (Signed)
Sent message, via epic in basket, requesting orders in epic from surgeon.  

## 2021-07-06 DIAGNOSIS — M1711 Unilateral primary osteoarthritis, right knee: Secondary | ICD-10-CM | POA: Diagnosis not present

## 2021-07-06 NOTE — H&P (Signed)
TOTAL KNEE ADMISSION H&P  Patient is being admitted for left total knee arthroplasty.  Subjective:  Chief Complaint:left knee pain.  HPI: Francisco Simmons., 70 y.o. male, has a history of pain and functional disability in the left knee due to arthritis and has failed non-surgical conservative treatments for greater than 12 weeks to includeNSAID's and/or analgesics, corticosteriod injections, viscosupplementation injections, flexibility and strengthening excercises, supervised PT with diminished ADL's post treatment, use of assistive devices, and activity modification.  Onset of symptoms was gradual, starting 10 years ago with gradually worsening course since that time. The patient noted prior procedures on the knee to include  arthroscopy and menisectomy on the left knee(s).  Patient currently rates pain in the left knee(s) at 10 out of 10 with activity. Patient has night pain, worsening of pain with activity and weight bearing, pain that interferes with activities of daily living, pain with passive range of motion, and joint swelling.  Patient has evidence of subchondral cysts, periarticular osteophytes, and joint space narrowing by imaging studies. There is no active infection.  Patient Active Problem List   Diagnosis Date Noted   Primary localized osteoarthritis of right knee 01/19/2021   Hypertension    Primary localized osteoarthritis of left knee    Benign prostatic hyperplasia    Past Medical History:  Diagnosis Date   BPH (benign prostatic hypertrophy)    Complication of anesthesia    Hard to wake up after carpal tunnel surgery   GERD (gastroesophageal reflux disease)    Hypertension    Primary localized osteoarthritis of left knee    Primary localized osteoarthritis of right knee 01/19/2021   Wears glasses     Past Surgical History:  Procedure Laterality Date   CARPAL TUNNEL RELEASE  2003   right   CARPAL TUNNEL RELEASE Left 04/15/2014   Procedure: LEFT CARPAL TUNNEL  RELEASE;  Surgeon: Wyn Forster, MD;  Location: [facility];  Service: Orthopedics;  Laterality: Left;   COLONOSCOPY     INGUINAL HERNIA REPAIR  1992   left   KNEE ARTHROSCOPY  1990   right and left   TOTAL KNEE ARTHROPLASTY Right 02/01/2021   Procedure: TOTAL KNEE ARTHROPLASTY;  Surgeon: Salvatore Marvel, MD;  Location: [facility];  Service: Orthopedics;  Laterality: Right;    No current facility-administered medications for this encounter.   Current Outpatient Medications  Medication Sig Dispense Refill Last Dose   amLODipine (NORVASC) 10 MG tablet Take 10 mg by mouth daily.      aspirin EC 325 MG tablet 1 tablet a day for 30 days to prevent blood clots 30 tablet 0    aspirin EC 81 MG tablet Take 81 mg by mouth daily. Swallow whole.      atorvastatin (LIPITOR) 20 MG tablet Take 20 mg by mouth at bedtime.      benzonatate (TESSALON) 100 MG capsule Take 1-2 capsules (100-200 mg total) by mouth 3 (three) times daily as needed. 60 capsule 0    cetirizine (ZYRTEC) 5 MG tablet Take 1 tablet (5 mg total) by mouth daily. 30 tablet 0    docusate sodium (COLACE) 100 MG capsule Take 1 capsule (100 mg total) by mouth 2 (two) times daily. (Patient taking differently: Take 100 mg by mouth 2 (two) times daily as needed for mild constipation.) 60 capsule 2    doxazosin (CARDURA) 2 MG tablet Take 2 mg by mouth every evening.      gabapentin (NEURONTIN) 300 MG capsule Take  1 capsule (300 mg total) by mouth at bedtime. For nerve pain 30 capsule 0    losartan (COZAAR) 100 MG tablet Take 100 mg by mouth daily.      oxyCODONE (OXY IR/ROXICODONE) 5 MG immediate release tablet Take 1 tablet (5 mg total) by mouth every 4 (four) hours as needed for severe pain. 42 tablet 0    polyethylene glycol (MIRALAX) 17 g packet Take 17 g by mouth 2 (two) times daily. 17 grams in 6 oz of favorite drink twice a day until bowel movement.  LAXITIVE.  Restart if two days since last bowel movement (Patient taking  differently: Take 17 g by mouth daily as needed for mild constipation.) 14 packet 0    promethazine-dextromethorphan (PROMETHAZINE-DM) 6.25-15 MG/5ML syrup Take 5 mLs by mouth at bedtime as needed for cough. 100 mL 0    No Known Allergies  Social History   Tobacco Use   Smoking status: Former    Types: Cigarettes    Quit date: 04/14/1984    Years since quitting: 37.2   Smokeless tobacco: Never  Substance Use Topics   Alcohol use: Yes    Comment: Rare    Family History  Problem Relation Age of Onset   Hypertension Mother    Lung cancer Father      Review of Systems  Constitutional: Negative.   HENT: Negative.    Eyes: Negative.   Respiratory: Negative.    Cardiovascular: Negative.   Gastrointestinal: Negative.   Endocrine: Negative.   Genitourinary: Negative.   Musculoskeletal:  Positive for back pain, gait problem and joint swelling.  Skin: Negative.   Allergic/Immunologic: Negative.   Hematological: Negative.   Psychiatric/Behavioral: Negative.     Objective:  Physical Exam Constitutional:      Appearance: Normal appearance.  HENT:     Head: Normocephalic.     Right Ear: External ear normal.     Left Ear: External ear normal.     Nose: Nose normal.     Mouth/Throat:     Mouth: Mucous membranes are moist.  Eyes:     Conjunctiva/sclera: Conjunctivae normal.  Cardiovascular:     Rate and Rhythm: Normal rate and regular rhythm.  Pulmonary:     Effort: Pulmonary effort is normal.     Breath sounds: Normal breath sounds.  Abdominal:     General: Abdomen is flat. Bowel sounds are normal.  Genitourinary:    Comments: Not pertinent to current symptomatology therefore not examined.  Musculoskeletal:     Cervical back: Neck supple.     Comments: Left knee has active range of motion.  -5 to 120 degrees.  2+ crepitus.  2+ synovitis.  Distal neurovascular exam is intact.  Right knee has well healed total knee incision.  Active range of motion 0-110 degrees.      Skin:    General: Skin is warm and dry.     Capillary Refill: Capillary refill takes less than 2 seconds.  Neurological:     General: No focal deficit present.     Mental Status: He is alert.  Psychiatric:        Mood and Affect: Mood normal.        Behavior: Behavior normal.    Vital signs in last 24 hours: Temp:  [97.7 F (36.5 C)] 97.7 F (36.5 C) (09/13 1400) Pulse Rate:  [67] 67 (09/13 1400) BP: (137)/(85) 137/85 (09/13 1400) SpO2:  [98 %] 98 % (09/13 1400) Weight:  [96.3 kg] 96.3 kg (09/13  1400)  Labs:   Estimated body mass index is 28.81 kg/m as calculated from the following:   Height as of this encounter: 6' (1.829 m).   Weight as of this encounter: 96.3 kg.   Imaging Review Plain radiographs demonstrate severe degenerative joint disease of the left knee(s). The overall alignment issignificant varus. The bone quality appears to be good for age and reported activity level.      Assessment/Plan:  End stage arthritis, left knee   The patient history, physical examination, clinical judgment of the provider and imaging studies are consistent with end stage degenerative joint disease of the left knee(s) and total knee arthroplasty is deemed medically necessary. The treatment options including medical management, injection therapy arthroscopy and arthroplasty were discussed at length. The risks and benefits of total knee arthroplasty were presented and reviewed. The risks due to aseptic loosening, infection, stiffness, patella tracking problems, thromboembolic complications and other imponderables were discussed. The patient acknowledged the explanation, agreed to proceed with the plan and consent was signed. Patient is being admitted for inpatient treatment for surgery, pain control, PT, OT, prophylactic antibiotics, VTE prophylaxis, progressive ambulation and ADL's and discharge planning. The patient is planning to be discharged same day if he does well.

## 2021-07-12 NOTE — Progress Notes (Signed)
DUE TO COVID-19 ONLY ONE VISITOR IS ALLOWED TO COME WITH YOU AND STAY IN THE WAITING ROOM ONLY DURING PRE OP AND PROCEDURE DAY OF SURGERY.  2 VISITOR  MAY VISIT WITH YOU AFTER SURGERY IN YOUR PRIVATE ROOM DURING VISITING HOURS ONLY!  YOU NEED TO HAVE A COVID 19 TEST ON___  07/22/2021 ___@_  @_from  8am-3pm _____, THIS TEST MUST BE DONE BEFORE SURGERY,  Covid test is done at [address] [place], [place] [address].  This is a drive thru.  No appt required. Please see map.                 Your procedure is scheduled on:  07/26/2021   Report to [facility] Main  Entrance   Report to admitting at   [phone]     Call this number if you have problems the morning of surgery [phone]    REMEMBER: NO  SOLID FOOD CANDY OR GUM AFTER MIDNIGHT. CLEAR LIQUIDS UNTIL  0430am        . NOTHING BY MOUTH EXCEPT CLEAR LIQUIDS UNTIL 0430am  . PLEASE FINISH ENSURE DRINK PER SURGEON ORDER  WHICH NEEDS TO BE COMPLETED AT   0430am    .      CLEAR LIQUID DIET   Foods Allowed                                                                    Coffee and tea, regular and decaf                            Fruit ices (not with fruit pulp)                                      Iced Popsicles                                    Carbonated beverages, regular and diet                                    Cranberry, grape and apple juices Sports drinks like Gatorade Lightly seasoned clear broth or consume(fat free) Sugar, honey syrup ___________________________________________________________________      BRUSH YOUR TEETH MORNING OF SURGERY AND RINSE YOUR MOUTH OUT, NO CHEWING GUM CANDY OR MINTS.     Take these medicines the morning of surgery with A SIP OF WATER:  amlodipine, zyrtec   DO NOT TAKE ANY DIABETIC MEDICATIONS DAY OF YOUR SURGERY                               You may not have any metal on your body including hair pins and              piercings  Do not wear jewelry, make-up, lotions,  powders or perfumes, deodorant             Do not wear nail  polish on your fingernails.  Do not shave  48 hours prior to surgery.              Men may shave face and neck.   Do not bring valuables to the hospital. [place].  Contacts, dentures or bridgework may not be worn into surgery.  Leave suitcase in the car. After surgery it may be brought to your room.     Patients discharged the day of surgery will not be allowed to drive home. IF YOU ARE HAVING SURGERY AND GOING HOME THE SAME DAY, YOU MUST HAVE AN ADULT TO DRIVE YOU HOME AND BE WITH YOU FOR 24 HOURS. YOU MAY GO HOME BY TAXI OR UBER OR ORTHERWISE, BUT AN ADULT MUST ACCOMPANY YOU HOME AND STAY WITH YOU FOR 24 HOURS.  Name and phone number of your driver:  Special Instructions: N/A              Please read over the following fact sheets you were given: _____________________________________________________________________  [facility] - Preparing for Surgery Before surgery, you can play an important role.  Because skin is not sterile, your skin needs to be as free of germs as possible.  You can reduce the number of germs on your skin by washing with CHG (chlorahexidine gluconate) soap before surgery.  CHG is an antiseptic cleaner which kills germs and bonds with the skin to continue killing germs even after washing. Please DO NOT use if you have an allergy to CHG or antibacterial soaps.  If your skin becomes reddened/irritated stop using the CHG and inform your nurse when you arrive at Short Stay. Do not shave (including legs and underarms) for at least 48 hours prior to the first CHG shower.  You may shave your face/neck. Please follow these instructions carefully:  1.  Shower with CHG Soap the night before surgery and the  morning of Surgery.  2.  If you choose to wash your hair, wash your hair first as usual with your  normal  shampoo.  3.  After you shampoo, rinse your hair and body  thoroughly to remove the  shampoo.                           4.  Use CHG as you would any other liquid soap.  You can apply chg directly  to the skin and wash                       Gently with a scrungie or clean washcloth.  5.  Apply the CHG Soap to your body ONLY FROM THE NECK DOWN.   Do not use on face/ open                           Wound or open sores. Avoid contact with eyes, ears mouth and genitals (private parts).                       Wash face,  Genitals (private parts) with your normal soap.             6.  Wash thoroughly, paying special attention to the area where your surgery  will be performed.  7.  Thoroughly rinse your body with warm water from the  neck down.  8.  DO NOT shower/wash with your normal soap after using and rinsing off  the CHG Soap.                9.  Pat yourself dry with a clean towel.            10.  Wear clean pajamas.            11.  Place clean sheets on your bed the night of your first shower and do not  sleep with pets. Day of Surgery : Do not apply any lotions/deodorants the morning of surgery.  Please wear clean clothes to the hospital/surgery center.  FAILURE TO FOLLOW THESE INSTRUCTIONS MAY RESULT IN THE CANCELLATION OF YOUR SURGERY PATIENT SIGNATURE_________________________________  NURSE SIGNATURE__________________________________  ________________________________________________________________________

## 2021-07-13 DIAGNOSIS — M1711 Unilateral primary osteoarthritis, right knee: Secondary | ICD-10-CM | POA: Diagnosis not present

## 2021-07-13 NOTE — Progress Notes (Addendum)
Anesthesia Review:  PCP: Dr Sharyn Lull LOV 03/08/21  Cardiologist : none  Chest x-ray : EKG : 02/08/21  Echo : Stress test: Cardiac Cath :  Activity level: can do a flight of stairs without difficulty  Sleep Study/ CPAP : none  Fasting Blood Sugar :      / Checks Blood Sugar -- times a day:   Blood Thinner/ Instructions /Last Dose: ASA / Instructions/ Last Dose :   Aspirin - 81mg - PT to contact Dr and/or Dr Thurston Hole for instructions  Covid test on 07/22/21  Blood pressure at preop was 143/99.  Pt states he took am blood pressure meds today.  Denies any chest pain, shortness of breath, dizziness or headache.  Informed pt to keep a check on blood pressure.  PT reports he has a blood pressure cuff at home.   Potassium 2.9 at preop.  CMp routed to Dr 07/24/21 on 07/14/21.  07/16/21 made aware on preop of 07/14/21.  02/01/21- right knee replacement

## 2021-07-14 ENCOUNTER — Encounter (HOSPITAL_COMMUNITY)
Admission: RE | Admit: 2021-07-14 | Discharge: 2021-07-14 | Disposition: A | Discharge status: Home / Self Care | Payer: Medicare HMO | Source: Ambulatory Visit | Attending: Orthopedic Surgery | Admitting: Orthopedic Surgery

## 2021-07-14 ENCOUNTER — Other Ambulatory Visit: Payer: Self-pay

## 2021-07-14 ENCOUNTER — Encounter (HOSPITAL_COMMUNITY): Payer: Self-pay

## 2021-07-14 DIAGNOSIS — Z79899 Other long term (current) drug therapy: Secondary | ICD-10-CM | POA: Insufficient documentation

## 2021-07-14 DIAGNOSIS — Z01812 Encounter for preprocedural laboratory examination: Secondary | ICD-10-CM | POA: Insufficient documentation

## 2021-07-14 LAB — CBC WITH DIFFERENTIAL/PLATELET
Abs Immature Granulocytes: 0.01 10*3/uL (ref 0.00–0.07)
Basophils Absolute: 0 10*3/uL (ref 0.0–0.1)
Basophils Relative: 1 %
Eosinophils Absolute: 0.1 10*3/uL (ref 0.0–0.5)
Eosinophils Relative: 2 %
HCT: 42.5 % (ref 39.0–52.0)
Hemoglobin: 13.9 g/dL (ref 13.0–17.0)
Immature Granulocytes: 0 %
Lymphocytes Relative: 32 %
Lymphs Abs: 1.9 10*3/uL (ref 0.7–4.0)
MCH: 29.4 pg (ref 26.0–34.0)
MCHC: 32.7 g/dL (ref 30.0–36.0)
MCV: 90 fL (ref 80.0–100.0)
Monocytes Absolute: 0.7 10*3/uL (ref 0.1–1.0)
Monocytes Relative: 11 %
Neutro Abs: 3.2 10*3/uL (ref 1.7–7.7)
Neutrophils Relative %: 54 %
Platelets: 219 10*3/uL (ref 150–400)
RBC: 4.72 MIL/uL (ref 4.22–5.81)
RDW: 14.6 % (ref 11.5–15.5)
WBC: 6 10*3/uL (ref 4.0–10.5)
nRBC: 0 % (ref 0.0–0.2)

## 2021-07-14 LAB — COMPREHENSIVE METABOLIC PANEL
ALT: 13 U/L (ref 0–44)
AST: 12 U/L — ABNORMAL LOW (ref 15–41)
Albumin: 3.5 g/dL (ref 3.5–5.0)
Alkaline Phosphatase: 58 U/L (ref 38–126)
Anion gap: 5 (ref 5–15)
BUN: 13 mg/dL (ref 8–23)
CO2: 27 mmol/L (ref 22–32)
Calcium: 8.6 mg/dL — ABNORMAL LOW (ref 8.9–10.3)
Chloride: 106 mmol/L (ref 98–111)
Creatinine, Ser: 1.08 mg/dL (ref 0.61–1.24)
GFR, Estimated: >60 mL/min (ref >60)
Glucose, Bld: 100 mg/dL — ABNORMAL HIGH (ref 70–99)
Potassium: 2.9 mmol/L — ABNORMAL LOW (ref 3.5–5.1)
Sodium: 138 mmol/L (ref 135–145)
Total Bilirubin: 0.9 mg/dL (ref 0.3–1.2)
Total Protein: 7.2 g/dL (ref 6.5–8.1)

## 2021-07-14 LAB — SURGICAL PCR SCREEN
MRSA, PCR: NEGATIVE
Staphylococcus aureus: POSITIVE — AB

## 2021-07-19 NOTE — Care Plan (Signed)
Ortho Bundle Case Management Note  Patient Details  Name: Francisco Simmons. MRN: [id number] Date of Birth: 01/06/1951  Met with patient and family in the office prior to surgery. He will discharge to home with their assistance. Has a RW. CPM ordered for home. OPPT set up with [facility]. Patient and MD in agreement with plan. CHoice offered.                     DME Arranged:  [facility] DME Agency:  [employer]  HH Arranged:    [place] Agency:     Additional Comments: Please contact me with any questions of if this plan should need to change.  Ladell Heads,  [place]  [phone] 07/19/2021, 4:02 PM

## 2021-07-22 ENCOUNTER — Other Ambulatory Visit: Payer: Self-pay | Admitting: Orthopedic Surgery

## 2021-07-23 LAB — SARS CORONAVIRUS 2 (TAT 6-24 HRS): SARS Coronavirus 2: NEGATIVE

## 2021-07-25 NOTE — Anesthesia Preprocedure Evaluation (Addendum)
Anesthesia Evaluation  Patient identified by MRN, date of birth, ID band Patient awake    Reviewed: Allergy & Precautions, NPO status , Patient's Chart, lab work & pertinent test results  History of Anesthesia Complications (+) PROLONGED EMERGENCE  Airway Mallampati: I  TM Distance: >3 FB Neck ROM: Full    Dental no notable dental hx. (+) Lower Dentures, Dental Advisory Given   Pulmonary neg pulmonary ROS, former smoker,    Pulmonary exam normal        Cardiovascular hypertension, Pt. on medications Normal cardiovascular exam     Neuro/Psych negative neurological ROS  negative psych ROS   GI/Hepatic Neg liver ROS, GERD  Controlled,  Endo/Other  negative endocrine ROS  Renal/GU negative Renal ROS   BPH    Musculoskeletal  (+) Arthritis , Osteoarthritis,    Abdominal   Peds  Hematology negative hematology ROS (+)   Anesthesia Other Findings   Reproductive/Obstetrics                            Anesthesia Physical  Anesthesia Plan  ASA: 2  Anesthesia Plan: MAC, Spinal and Regional   Post-op Pain Management:  Regional for Post-op pain   Induction: Intravenous  PONV Risk Score and Plan: 1 and Ondansetron, Dexamethasone, Midazolam and Treatment may vary due to age or medical condition  Airway Management Planned: Simple Face Mask, Natural Airway and Nasal Cannula  Additional Equipment: None  Intra-op Plan:   Post-operative Plan:   Informed Consent: I have reviewed the patients History and Physical, chart, labs and discussed the procedure including the risks, benefits and alternatives for the proposed anesthesia with the patient or authorized representative who has indicated his/her understanding and acceptance.     Dental advisory given  Plan Discussed with: Anesthesiologist and CRNA  Anesthesia Plan Comments: (  )       Anesthesia Quick Evaluation

## 2021-07-26 ENCOUNTER — Encounter (HOSPITAL_COMMUNITY)
Admission: RE | Disposition: A | Discharge status: Home / Self Care | Payer: Self-pay | Source: Home / Self Care | Attending: Orthopedic Surgery

## 2021-07-26 ENCOUNTER — Ambulatory Visit (HOSPITAL_COMMUNITY): Payer: Medicare HMO | Admitting: Physician Assistant

## 2021-07-26 ENCOUNTER — Ambulatory Visit (HOSPITAL_COMMUNITY): Payer: Medicare HMO | Admitting: Certified Registered Nurse Anesthetist

## 2021-07-26 ENCOUNTER — Ambulatory Visit (HOSPITAL_COMMUNITY)
Admission: RE | Admit: 2021-07-26 | Discharge: 2021-07-26 | Disposition: A | Discharge status: Home / Self Care | Payer: Medicare HMO | Attending: Orthopedic Surgery | Admitting: Orthopedic Surgery

## 2021-07-26 DIAGNOSIS — Z7982 Long term (current) use of aspirin: Secondary | ICD-10-CM | POA: Diagnosis not present

## 2021-07-26 DIAGNOSIS — M1712 Unilateral primary osteoarthritis, left knee: Secondary | ICD-10-CM | POA: Diagnosis not present

## 2021-07-26 DIAGNOSIS — Z87891 Personal history of nicotine dependence: Secondary | ICD-10-CM | POA: Diagnosis not present

## 2021-07-26 DIAGNOSIS — Z96651 Presence of right artificial knee joint: Secondary | ICD-10-CM | POA: Insufficient documentation

## 2021-07-26 DIAGNOSIS — K219 Gastro-esophageal reflux disease without esophagitis: Secondary | ICD-10-CM | POA: Diagnosis not present

## 2021-07-26 DIAGNOSIS — I10 Essential (primary) hypertension: Secondary | ICD-10-CM | POA: Diagnosis not present

## 2021-07-26 DIAGNOSIS — G8918 Other acute postprocedural pain: Secondary | ICD-10-CM | POA: Diagnosis not present

## 2021-07-26 DIAGNOSIS — Z79899 Other long term (current) drug therapy: Secondary | ICD-10-CM | POA: Diagnosis not present

## 2021-07-26 DIAGNOSIS — Z96652 Presence of left artificial knee joint: Secondary | ICD-10-CM | POA: Diagnosis not present

## 2021-07-26 DIAGNOSIS — N400 Enlarged prostate without lower urinary tract symptoms: Secondary | ICD-10-CM | POA: Diagnosis present

## 2021-07-26 HISTORY — PX: TOTAL KNEE ARTHROPLASTY: SHX125

## 2021-07-26 LAB — COMPREHENSIVE METABOLIC PANEL
ALT: 15 U/L (ref 0–44)
AST: 18 U/L (ref 15–41)
Albumin: 3.5 g/dL (ref 3.5–5.0)
Alkaline Phosphatase: 69 U/L (ref 38–126)
Anion gap: 8 (ref 5–15)
BUN: 16 mg/dL (ref 8–23)
CO2: 26 mmol/L (ref 22–32)
Calcium: 8.6 mg/dL — ABNORMAL LOW (ref 8.9–10.3)
Chloride: 111 mmol/L (ref 98–111)
Creatinine, Ser: 1.18 mg/dL (ref 0.61–1.24)
GFR, Estimated: >60 mL/min (ref >60)
Glucose, Bld: 133 mg/dL — ABNORMAL HIGH (ref 70–99)
Potassium: 2.8 mmol/L — ABNORMAL LOW (ref 3.5–5.1)
Sodium: 145 mmol/L (ref 135–145)
Total Bilirubin: 0.5 mg/dL (ref 0.3–1.2)
Total Protein: 7 g/dL (ref 6.5–8.1)

## 2021-07-26 SURGERY — ARTHROPLASTY, KNEE, TOTAL
Anesthesia: Monitor Anesthesia Care | Site: Knee | Laterality: Left

## 2021-07-26 MED ORDER — TRANEXAMIC ACID-NACL 1000-0.7 MG/100ML-% IV SOLN
1000.0000 mg | Freq: Once | INTRAVENOUS | Status: AC
  Administered 2021-07-26: 1000 mg via INTRAVENOUS

## 2021-07-26 MED ORDER — BUPIVACAINE LIPOSOME 1.3 % IJ SUSP
INTRAMUSCULAR | Status: DC | PRN
  Administered 2021-07-26: 20 mL

## 2021-07-26 MED ORDER — ASPIRIN EC 325 MG PO TBEC: DELAYED_RELEASE_TABLET | ORAL | 0 refills | Status: DC

## 2021-07-26 MED ORDER — OXYCODONE HCL 5 MG PO TABS: 10.0000 mg | ORAL_TABLET | ORAL | Status: DC | PRN

## 2021-07-26 MED ORDER — AMISULPRIDE (ANTIEMETIC) 5 MG/2ML IV SOLN: 10.0000 mg | Freq: Once | INTRAVENOUS | Status: DC | PRN

## 2021-07-26 MED ORDER — CEFAZOLIN SODIUM-DEXTROSE 2-4 GM/100ML-% IV SOLN
2.0000 g | Freq: Four times a day (QID) | INTRAVENOUS | Status: DC
  Administered 2021-07-26: 2 g via INTRAVENOUS

## 2021-07-26 MED ORDER — EPHEDRINE SULFATE-NACL 50-0.9 MG/10ML-% IV SOSY
PREFILLED_SYRINGE | INTRAVENOUS | Status: DC | PRN
  Administered 2021-07-26 (×2): 5 mg via INTRAVENOUS

## 2021-07-26 MED ORDER — POVIDONE-IODINE 10 % EX SWAB
2.0000 | Freq: Once | CUTANEOUS | Status: AC
Start: 2021-07-26 — End: 2021-07-26
  Administered 2021-07-26: 2 via TOPICAL

## 2021-07-26 MED ORDER — DEXAMETHASONE SODIUM PHOSPHATE 10 MG/ML IJ SOLN
INTRAMUSCULAR | Status: AC
  Filled 2021-07-26: qty 1

## 2021-07-26 MED ORDER — WATER FOR IRRIGATION, STERILE IR SOLN
Status: DC | PRN
  Administered 2021-07-26: 2000 mL

## 2021-07-26 MED ORDER — TRANEXAMIC ACID-NACL 1000-0.7 MG/100ML-% IV SOLN
INTRAVENOUS | Status: AC
  Filled 2021-07-26: qty 100

## 2021-07-26 MED ORDER — PROPOFOL 1000 MG/100ML IV EMUL
INTRAVENOUS | Status: AC
  Filled 2021-07-26: qty 100

## 2021-07-26 MED ORDER — FENTANYL CITRATE PF 50 MCG/ML IJ SOSY: 25.0000 ug | PREFILLED_SYRINGE | INTRAMUSCULAR | Status: DC | PRN

## 2021-07-26 MED ORDER — POLYETHYLENE GLYCOL 3350 17 G PO PACK: 17.0000 g | PACK | Freq: Two times a day (BID) | ORAL | 0 refills | Status: DC

## 2021-07-26 MED ORDER — LACTATED RINGERS IV SOLN: INTRAVENOUS | Status: DC

## 2021-07-26 MED ORDER — ACETAMINOPHEN 500 MG PO TABS
1000.0000 mg | ORAL_TABLET | Freq: Once | ORAL | Status: AC
  Administered 2021-07-26: 1000 mg via ORAL
  Filled 2021-07-26: qty 2

## 2021-07-26 MED ORDER — CEFAZOLIN SODIUM-DEXTROSE 2-4 GM/100ML-% IV SOLN
INTRAVENOUS | Status: AC
  Filled 2021-07-26: qty 100

## 2021-07-26 MED ORDER — BUPIVACAINE LIPOSOME 1.3 % IJ SUSP
INTRAMUSCULAR | Status: AC
  Filled 2021-07-26: qty 10

## 2021-07-26 MED ORDER — OXYCODONE HCL 5 MG PO TABS
ORAL_TABLET | ORAL | Status: AC
  Filled 2021-07-26: qty 1

## 2021-07-26 MED ORDER — LACTATED RINGERS IV BOLUS
250.0000 mL | Freq: Once | INTRAVENOUS | Status: AC
  Administered 2021-07-26: 250 mL via INTRAVENOUS

## 2021-07-26 MED ORDER — DEXAMETHASONE SODIUM PHOSPHATE 10 MG/ML IJ SOLN
INTRAMUSCULAR | Status: DC | PRN
  Administered 2021-07-26: 5 mg

## 2021-07-26 MED ORDER — DEXAMETHASONE SODIUM PHOSPHATE 10 MG/ML IJ SOLN
8.0000 mg | Freq: Once | INTRAMUSCULAR | Status: AC
  Administered 2021-07-26: 4 mg via INTRAVENOUS

## 2021-07-26 MED ORDER — CEFAZOLIN SODIUM-DEXTROSE 2-4 GM/100ML-% IV SOLN
2.0000 g | INTRAVENOUS | Status: AC
  Administered 2021-07-26: 2 g via INTRAVENOUS
  Filled 2021-07-26: qty 100

## 2021-07-26 MED ORDER — FENTANYL CITRATE (PF) 100 MCG/2ML IJ SOLN
INTRAMUSCULAR | Status: DC | PRN
  Administered 2021-07-26 (×2): 50 ug via INTRAVENOUS

## 2021-07-26 MED ORDER — PROPOFOL 500 MG/50ML IV EMUL
INTRAVENOUS | Status: AC
  Filled 2021-07-26: qty 50

## 2021-07-26 MED ORDER — PROPOFOL 500 MG/50ML IV EMUL
INTRAVENOUS | Status: DC | PRN
  Administered 2021-07-26: 90 ug/kg/min via INTRAVENOUS

## 2021-07-26 MED ORDER — MIDAZOLAM HCL 2 MG/2ML IJ SOLN
INTRAMUSCULAR | Status: AC
  Filled 2021-07-26: qty 2

## 2021-07-26 MED ORDER — FENTANYL CITRATE (PF) 100 MCG/2ML IJ SOLN
INTRAMUSCULAR | Status: AC
  Filled 2021-07-26: qty 2

## 2021-07-26 MED ORDER — SODIUM CHLORIDE (PF) 0.9 % IJ SOLN
INTRAMUSCULAR | Status: DC | PRN
  Administered 2021-07-26: 50 mL

## 2021-07-26 MED ORDER — ROPIVACAINE HCL 5 MG/ML IJ SOLN
INTRAMUSCULAR | Status: DC | PRN
  Administered 2021-07-26: 20 mL via PERINEURAL

## 2021-07-26 MED ORDER — PHENYLEPHRINE HCL (PRESSORS) 10 MG/ML IV SOLN
INTRAVENOUS | Status: AC
  Filled 2021-07-26: qty 2

## 2021-07-26 MED ORDER — EPHEDRINE 5 MG/ML INJ
INTRAVENOUS | Status: AC
  Filled 2021-07-26: qty 5

## 2021-07-26 MED ORDER — MIDAZOLAM HCL 5 MG/5ML IJ SOLN
INTRAMUSCULAR | Status: DC | PRN
  Administered 2021-07-26 (×2): 1 mg via INTRAVENOUS

## 2021-07-26 MED ORDER — PHENYLEPHRINE 40 MCG/ML (10ML) SYRINGE FOR IV PUSH (FOR BLOOD PRESSURE SUPPORT)
PREFILLED_SYRINGE | INTRAVENOUS | Status: DC | PRN
  Administered 2021-07-26: 40 ug via INTRAVENOUS
  Administered 2021-07-26 (×2): 80 ug via INTRAVENOUS
  Administered 2021-07-26 (×3): 40 ug via INTRAVENOUS

## 2021-07-26 MED ORDER — ONDANSETRON HCL 4 MG/2ML IJ SOLN
INTRAMUSCULAR | Status: AC
  Filled 2021-07-26: qty 2

## 2021-07-26 MED ORDER — ONDANSETRON HCL 4 MG/2ML IJ SOLN
INTRAMUSCULAR | Status: DC | PRN
  Administered 2021-07-26: 4 mg via INTRAVENOUS

## 2021-07-26 MED ORDER — POVIDONE-IODINE 10 % EX SWAB: 2.0000 "application " | Freq: Once | CUTANEOUS | Status: DC

## 2021-07-26 MED ORDER — BUPIVACAINE LIPOSOME 1.3 % IJ SUSP: 20.0000 mL | Freq: Once | INTRAMUSCULAR | Status: DC

## 2021-07-26 MED ORDER — DOCUSATE SODIUM 100 MG PO CAPS: 100.0000 mg | ORAL_CAPSULE | Freq: Two times a day (BID) | ORAL | 2 refills | Status: DC

## 2021-07-26 MED ORDER — OXYCODONE HCL 5 MG PO TABS
5.0000 mg | ORAL_TABLET | ORAL | Status: DC | PRN
  Administered 2021-07-26: 5 mg via ORAL

## 2021-07-26 MED ORDER — PROPOFOL 10 MG/ML IV BOLUS
INTRAVENOUS | Status: AC
  Filled 2021-07-26: qty 40

## 2021-07-26 MED ORDER — POVIDONE-IODINE 7.5 % EX SOLN: Freq: Once | CUTANEOUS | Status: DC

## 2021-07-26 MED ORDER — PROPOFOL 10 MG/ML IV BOLUS
INTRAVENOUS | Status: DC | PRN
  Administered 2021-07-26 (×2): 20 mg via INTRAVENOUS

## 2021-07-26 MED ORDER — TRANEXAMIC ACID-NACL 1000-0.7 MG/100ML-% IV SOLN
1000.0000 mg | INTRAVENOUS | Status: AC
  Administered 2021-07-26: 1000 mg via INTRAVENOUS
  Filled 2021-07-26: qty 100

## 2021-07-26 MED ORDER — SODIUM CHLORIDE 0.9 % IV SOLN
INTRAVENOUS | Status: DC | PRN
  Administered 2021-07-26: 1.5 g via INTRAVENOUS

## 2021-07-26 MED ORDER — GABAPENTIN 300 MG PO CAPS: ORAL_CAPSULE | ORAL | 0 refills | Status: DC

## 2021-07-26 MED ORDER — LACTATED RINGERS IV BOLUS
500.0000 mL | Freq: Once | INTRAVENOUS | Status: AC
Start: 2021-07-26 — End: 2021-07-26
  Administered 2021-07-26: 500 mL via INTRAVENOUS

## 2021-07-26 MED ORDER — SODIUM CHLORIDE 0.9 % IR SOLN
Status: DC | PRN
  Administered 2021-07-26: 1000 mL

## 2021-07-26 MED ORDER — BUPIVACAINE IN DEXTROSE 0.75-8.25 % IT SOLN
INTRATHECAL | Status: DC | PRN
  Administered 2021-07-26: 1.8 mL via INTRATHECAL

## 2021-07-26 MED ORDER — PROMETHAZINE HCL 25 MG/ML IJ SOLN: 6.2500 mg | INTRAMUSCULAR | Status: DC | PRN

## 2021-07-26 MED ORDER — BUPIVACAINE-EPINEPHRINE 0.25% -1:200000 IJ SOLN
INTRAMUSCULAR | Status: DC | PRN
  Administered 2021-07-26: 30 mL

## 2021-07-26 MED ORDER — OXYCODONE HCL 5 MG PO TABS: 5.0000 mg | ORAL_TABLET | ORAL | 0 refills | Status: DC | PRN

## 2021-07-26 MED ORDER — BUPIVACAINE-EPINEPHRINE (PF) 0.25% -1:200000 IJ SOLN
INTRAMUSCULAR | Status: AC
  Filled 2021-07-26: qty 30

## 2021-07-26 MED ORDER — 0.9 % SODIUM CHLORIDE (POUR BTL) OPTIME
TOPICAL | Status: DC | PRN
  Administered 2021-07-26: 1000 mL

## 2021-07-26 MED ORDER — CEFUROXIME SODIUM 1.5 G IV SOLR
INTRAVENOUS | Status: AC
  Filled 2021-07-26: qty 1.5

## 2021-07-26 SURGICAL SUPPLY — 55 items
ATTUNE MED DOME PAT 41 KNEE (Knees) ×2 IMPLANT
ATTUNE PS FEM LT SZ 8 CEM KNEE (Femur) ×2 IMPLANT
ATTUNE PSRP INSR SZ8 7 KNEE (Insert) ×2 IMPLANT
BAG COUNTER SPONGE SURGICOUNT (BAG) IMPLANT
BAG ZIPLOCK 12X15 (MISCELLANEOUS) ×2 IMPLANT
BASE TIBIAL ATTUNE KNEE SZ9 (Knees) ×1 IMPLANT
BLADE SAGITTAL 25.0X1.19X90 (BLADE) ×2 IMPLANT
BLADE SAW SGTL 13X75X1.27 (BLADE) ×2 IMPLANT
BLADE SURG SZ10 CARB STEEL (BLADE) ×4 IMPLANT
BOWL SMART MIX CTS (DISPOSABLE) ×2 IMPLANT
CEMENT HV SMART SET (Cement) ×4 IMPLANT
COVER SURGICAL LIGHT HANDLE (MISCELLANEOUS) ×2 IMPLANT
CUFF TOURN SGL QUICK 34 (TOURNIQUET CUFF) ×1
CUFF TRNQT CYL 34X4.125X (TOURNIQUET CUFF) ×1 IMPLANT
DECANTER SPIKE VIAL GLASS SM (MISCELLANEOUS) ×4 IMPLANT
DRAPE INCISE IOBAN 66X45 STRL (DRAPES) ×6 IMPLANT
DRAPE ORTHO 2.5IN SPLIT 77X108 (DRAPES) ×1 IMPLANT
DRAPE ORTHO SPLIT 77X108 STRL (DRAPES) ×1
DRAPE SHEET LG 3/4 BI-LAMINATE (DRAPES) ×2 IMPLANT
DRAPE U-SHAPE 47X51 STRL (DRAPES) ×2 IMPLANT
DRSG AQUACEL AG ADV 3.5X10 (GAUZE/BANDAGES/DRESSINGS) ×2 IMPLANT
DRSG PAD ABDOMINAL 8X10 ST (GAUZE/BANDAGES/DRESSINGS) ×2 IMPLANT
DURAPREP 26ML APPLICATOR (WOUND CARE) ×4 IMPLANT
ELECT REM PT RETURN 15FT ADLT (MISCELLANEOUS) ×2 IMPLANT
GLOVE SURG ENC MOIS LTX SZ7 (GLOVE) ×2 IMPLANT
GLOVE SURG MICRO LTX SZ7.5 (GLOVE) ×2 IMPLANT
GLOVE SURG UNDER POLY LF SZ7 (GLOVE) ×2 IMPLANT
GLOVE SURG UNDER POLY LF SZ7.5 (GLOVE) ×2 IMPLANT
GOWN STRL REUS W/ TWL LRG LVL3 (GOWN DISPOSABLE) ×2 IMPLANT
GOWN STRL REUS W/TWL LRG LVL3 (GOWN DISPOSABLE) ×2
HANDPIECE INTERPULSE COAX TIP (DISPOSABLE) ×1
HOLDER FOLEY CATH W/STRAP (MISCELLANEOUS) IMPLANT
HOOD PEEL AWAY FLYTE STAYCOOL (MISCELLANEOUS) ×6 IMPLANT
KIT TURNOVER KIT A (KITS) ×2 IMPLANT
MANIFOLD NEPTUNE II (INSTRUMENTS) ×2 IMPLANT
MARKER SKIN DUAL TIP RULER LAB (MISCELLANEOUS) ×2 IMPLANT
NDL SAFETY ECLIPSE 18X1.5 (NEEDLE) ×1 IMPLANT
NEEDLE HYPO 18GX1.5 SHARP (NEEDLE) ×1
NS IRRIG 1000ML POUR BTL (IV SOLUTION) ×2 IMPLANT
PACK TOTAL KNEE CUSTOM (KITS) ×2 IMPLANT
PROTECTOR NERVE ULNAR (MISCELLANEOUS) ×2 IMPLANT
SET HNDPC FAN SPRY TIP SCT (DISPOSABLE) ×1 IMPLANT
STAPLER VISISTAT 35W (STAPLE) IMPLANT
STRIP CLOSURE SKIN 1/2X4 (GAUZE/BANDAGES/DRESSINGS) ×2 IMPLANT
SUT MNCRL AB 3-0 PS2 18 (SUTURE) ×2 IMPLANT
SUT VIC AB 0 CT1 36 (SUTURE) ×2 IMPLANT
SUT VIC AB 1 CT1 36 (SUTURE) ×2 IMPLANT
SUT VIC AB 2-0 CT1 27 (SUTURE) ×4
SUT VIC AB 2-0 CT1 TAPERPNT 27 (SUTURE) ×2 IMPLANT
SYR 20ML LL LF (SYRINGE) ×4 IMPLANT
SYR 30ML LL (SYRINGE) ×4 IMPLANT
TAPE STRIPS DRAPE STRL (GAUZE/BANDAGES/DRESSINGS) ×2 IMPLANT
TIBIAL BASE ATTUNE KNEE SZ9 (Knees) ×2 IMPLANT
TRAY FOLEY MTR SLVR 14FR STAT (SET/KITS/TRAYS/PACK) ×2 IMPLANT
WATER STERILE IRR 1000ML POUR (IV SOLUTION) ×4 IMPLANT

## 2021-07-26 NOTE — Anesthesia Procedure Notes (Signed)
Spinal  Patient location during procedure: OR Start time: 07/26/2021 7:24 AM End time: 07/26/2021 7:29 AM Reason for block: surgical anesthesia Staffing Performed: resident/CRNA  Anesthesiologist: Duane Boston, MD Resident/CRNA: West Pugh, CRNA Preanesthetic Checklist Completed: patient identified, IV checked, site marked, risks and benefits discussed, surgical consent, monitors and equipment checked, pre-op evaluation and timeout performed Spinal Block Patient position: sitting Prep: DuraPrep Patient monitoring: heart rate, cardiac monitor, continuous pulse ox and blood pressure Approach: midline Location: L3-4 Injection technique: single-shot Needle Needle type: Pencan and Introducer  Needle gauge: 24 G Needle length: 10 cm Assessment Sensory level: T4 Events: CSF return Additional Notes IV functioning, monitors applied to pt. Expiration date of kit checked and confirmed to be in date. Sterile prep and drape, hand hygiene and sterile gloved used. Pt was positioned and spine was prepped in sterile fashion. Skin was anesthetized with lidocaine. Free flow of clear CSF obtained prior to injecting local anesthetic into CSF. Spinal needle aspirated freely following injection. Needle was carefully withdrawn, and pt tolerated procedure well. Loss of motor and sensory on exam post injection. Dr Tobias Alexander present for procedure.

## 2021-07-26 NOTE — Progress Notes (Signed)
Physical Therapy Treatment Patient Details Name: Francisco Simmons. MRN: [id number] DOB: August 03, 1951 Today's Date: 07/26/2021   History of Present Illness Patient is 70 y.o. male s/p Lt TKA on 07/26/21 with PMH significant for BPH, GERD, HTN, Rt TKA on 02/01/21.    PT Comments    Patient much improved during second session and VSS throughout (see below). Pt demonstrated good carryover for safe sequencing of bed mobility and transfers with RW; min guard for safety.  Patient ambulated ~62' with spouse provided guarding with supervision from therapist and cues for safety. Patient negotiated stairs with cues for pattern/walker management from therapist and wife providing safe guarding. Educated on HEP for ROM and circulation and addressed questions for discharge. Acute PT will continue to progress pt as able if he remains in hospital; he is at safe level to discharge home with assist from spouse.  HR in 60's at rest and 90's during activity Time              BP(mmHg)              Position 1500              137/95                      Supine 1511              132/87                    Seated 1515              128/88                    Standing 1530              137/93                      Seated    Recommendations for follow up therapy are one component of a multi-disciplinary discharge planning process, led by the attending physician.  Recommendations may be updated based on patient status, additional functional criteria and insurance authorization.  Follow Up Recommendations  Follow surgeon's recommendation for DC plan and follow-up therapies     Equipment Recommendations  None recommended by PT    Recommendations for Other Services       Precautions / Restrictions Precautions Precautions: Fall Restrictions Weight Bearing Restrictions: No Other Position/Activity Restrictions: WBAT     Mobility  Bed Mobility Overal bed mobility: Needs Assistance Bed Mobility: Supine to Sit;Sit to  Supine     Supine to sit: Min guard;Supervision Sit to supine: Min guard   General bed mobility comments: no cues needed for sequencing, pt taking extra time.    Transfers Overall transfer level: Needs assistance Equipment used: Rolling walker (2 wheeled) Transfers: Sit to/from Stand Sit to Stand: From elevated surface;Supervision (stretcher)         General transfer comment: pt withgood carryover for hand placement, no assist needed to rise. cues for reach back and to extend Lt knee for pain management.  Ambulation/Gait Ambulation/Gait assistance: Min guard;Supervision Gait Distance (Feet): 80 Feet Assistive device: Rolling walker (2 wheeled) Gait Pattern/deviations: Step-to pattern;Decreased stride length;Decreased weight shift to left Gait velocity: decr   General Gait Details: cues for step to pattern and proximity to RW, no overt LOB or buckling at Lt knee. patient's wife provided safe guarding with cues and supervision from therapist.   Stairs Stairs:  Yes Stairs assistance: Min guard;Supervision Stair Management: Two rails;Step to pattern;Forwards Number of Stairs: 3 General stair comments: cues for sequencing up with good, down with bad for stair mobility. no overt LOB noted. pt's wife provided safe guarding with cues and supervision from therapist.   Wheelchair Mobility    Modified Rankin (Stroke Patients Only)       Balance Overall balance assessment: Needs assistance Sitting-balance support: Feet supported Sitting balance-Leahy Scale: Good     Standing balance support: During functional activity;Bilateral upper extremity supported Standing balance-Leahy Scale: Fair                              Cognition Arousal/Alertness: Awake/alert Behavior During Therapy: WFL for tasks assessed/performed Overall Cognitive Status: Within Functional Limits for tasks assessed                                        Exercises Total  Joint Exercises Ankle Circles/Pumps: AROM;Both;15 reps;Supine Quad Sets: AROM;Left;Other reps (comment);Supine Heel Slides: AROM;Left;Other reps (comment);Supine Hip ABduction/ADduction: AROM;Left;Other reps (comment);Supine    General Comments        Pertinent Vitals/Pain Pain Assessment: Faces Faces Pain Scale: Hurts a little bit Pain Location: Lt knee Pain Descriptors / Indicators: Aching;Discomfort Pain Intervention(s): Limited activity within patient's tolerance;Monitored during session;Repositioned    Home Living                      Prior Function            PT Goals (current goals can now be found in the care plan section) Acute Rehab PT Goals Patient Stated Goal: recover safely PT Goal Formulation: With patient/family Time For Goal Achievement: 08/02/21 Potential to Achieve Goals: Good Progress towards PT goals: Progressing toward goals    Frequency    7X/week      PT Plan Current plan remains appropriate    Co-evaluation              AM-PAC PT "6 Clicks" Mobility   Outcome Measure  Help needed turning from your back to your side while in a flat bed without using bedrails?: None Help needed moving from lying on your back to sitting on the side of a flat bed without using bedrails?: A Little Help needed moving to and from a bed to a chair (including a wheelchair)?: A Little Help needed standing up from a chair using your arms (e.g., wheelchair or bedside chair)?: A Little Help needed to walk in hospital room?: A Little Help needed climbing 3-5 steps with a railing? : A Lot 6 Click Score: 18    End of Session Equipment Utilized During Treatment: Gait belt Activity Tolerance: Patient tolerated treatment well Patient left: with family/visitor present;in bed;with call bell/phone within reach Nurse Communication: Mobility status PT Visit Diagnosis: Muscle weakness (generalized) (M62.81);Difficulty in walking, not elsewhere classified  (R26.2);Pain Pain - Right/Left: Left Pain - part of body: Knee     Time: 7846-9629 PT Time Calculation (min) (ACUTE ONLY): 28 min  Charges:  $Gait Training: 8-22 mins $Therapeutic Exercise: 8-22 mins                     Wynn Maudlin, DPT Acute Rehabilitation Services Office [phone] Pager [phone]    Anitra Lauth 07/26/2021, 4:10 PM

## 2021-07-26 NOTE — Interval H&P Note (Signed)
History and Physical Interval Note:  07/26/2021 7:06 AM  Francisco Simmons.  has presented today for surgery, with the diagnosis of OA LEFT KNEE.  The various methods of treatment have been discussed with the patient and family. After consideration of risks, benefits and other options for treatment, the patient has consented to  Procedure(s): TOTAL KNEE ARTHROPLASTY (Left) as a surgical intervention.  The patient's history has been reviewed, patient examined, no change in status, stable for surgery.  I have reviewed the patient's chart and labs.  Questions were answered to the patient's satisfaction.     [name]

## 2021-07-26 NOTE — Op Note (Signed)
MRN:     [id number] DOB/AGE:    December 06, 1950 / 70 y.o.       OPERATIVE REPORT   DATE OF PROCEDURE:  07/26/2021      PREOPERATIVE DIAGNOSIS:   Primary Localized Osteoarthritis left Knee       Estimated body mass index is 28.81 kg/m as calculated from the following:   Height as of this encounter: 6' (1.829 m).   Weight as of this encounter: 96.3 kg.                                                       POSTOPERATIVE DIAGNOSIS:   Same                                                                 PROCEDURE:  Procedure(s): TOTAL KNEE ARTHROPLASTY Using Depuy Attune RP implants #8 Femur, #9Tibia, 51mm  RP bearing, 41 Patella    SURGEON: Shawni Volkov A. Thurston Hole, MD   ASSISTANT: Julien Girt, PA-C, present and scrubbed throughout the case, critical for retraction, instrumentation, and closure.  ANESTHESIA: Spinal with Adductor Nerve Block  TOURNIQUET TIME: 60 minutes   COMPLICATIONS:  None       SPECIMENS: None   INDICATIONS FOR PROCEDURE: The patient has djd of the knee with varus deformities, XR shows bone on bone arthritis. Patient has failed all conservative measures including anti-inflammatory medicines, narcotics, attempts at exercise and weight loss, cortisone injections and viscosupplementation.  Risks and benefits of surgery have been discussed, questions answered.    DESCRIPTION OF PROCEDURE: The patient identified by armband, received right adductor canal block and IV antibiotics, in the holding area at [facility]. Patient taken to the operating room, appropriate anesthetic monitors were attached. Spinal anesthesia induced with the patient in supine position, Foley catheter was inserted. Tourniquet applied high to the operative thigh. Lateral post and foot positioner applied to the table, the lower extremity was then prepped and draped in usual sterile fashion from the ankle to the tourniquet. Time-out procedure was performed. The limb was wrapped with an Esmarch bandage and the  tourniquet inflated to 365 mmHg.   We began the operation by making a 6cm anterior midline incision. Small bleeders in the skin and the subcutaneous tissue identified and cauterized. Transverse retinaculum was incised and reflected medially and a medial parapatellar arthrotomy was accomplished. the patella was everted and theprepatellar fat pad resected. The superficial medial collateral ligament was then elevated from anterior to posterior along the proximal flare of the tibia and anterior half of the menisci resected. The knee was hyperflexed exposing bone on bone arthritis. Peripheral and notch osteophytes as well as the cruciate ligaments were then resected. We continued to work our way around posteriorly along the proximal tibia, and externally rotated the tibia subluxing it out from underneath the femur. A McHale retractor was placed through the notch and a lateral Hohmann retractor placed, and an external tibial guide was placed.  The tibial cutting guide was pinned into place allowing resection of 4 mm of bone medially and about 6 mm of bone laterally because of her varus deformity.  Satisfied with the tibial resection, we then entered the distal femur 2 mm anterior to the PCL origin with the intramedullary guide rod and applied the distal femoral cutting guide set at 23mm, with 5 degrees of valgus. This was pinned along the epicondylar axis. At this point, the distal femoral cut was accomplished without difficulty. We then sized for a 8 femoral component and pinned the guide in 3 degrees of external rotation.The chamfer cutting guide was pinned into place. The anterior, posterior, and chamfer cuts were accomplished without difficulty followed by the  RP box cutting guide and the box cut. We also removed posterior osteophytes from the posterior femoral condyles. At this time, the knee was brought into full extension. We checked our extension and flexion gaps and found them symmetric at 7.  The patella  thickness measured at 13m m. We set the cutting guide at 15 and removed the posterior patella sized for 41 button and drilled the lollipop. The knee was then once again hyperflexed exposing the proximal tibia. We sized for a # 9 tibial base plate, applied the smokestack and the conical reamer followed by the the Delta fin keel punch. We then hammered into place the  RP trial femoral component, inserted a trial bearing, trial patellar button, and took the knee through range of motion from 0-130 degrees. No thumb pressure was required for patellar tracking.   At this point, all trial components were removed, a double batch of DePuy HV cement  and Zinacef 1.5gms was mixed and applied to all bony metallic mating surfaces. In order, we hammered into place the tibial tray and removed excess cement, the femoral component and removed excess cement, a 7 mm  RP bearing was inserted, and the knee brought to full extension with compression. The patellar button was clamped into place, and excess cement removed. While the cement cured the wound was irrigated out with normal saline solution pulse lavage, and exparel was injected throughout the knee. Ligament stability and patellar tracking were checked and found to be excellent..   The parapatellar arthrotomy was closed with  #1 Vicryl suture. The subcutaneous tissue with 0 and 2-0 undyed Vicryl suture, and 4-0 Monocryl.. A dressing of Aquaseal, 4 x 4, dressing sponges, Webril, and Ace wrap applied. Needle and sponge count were correct times 2.The patient awakened, extubated, and taken to recovery room without difficulty. Vascular status was normal, pulses 2+ and symmetric.    [name] 01/15/2018, 8:56 AM

## 2021-07-26 NOTE — Anesthesia Postprocedure Evaluation (Signed)
Anesthesia Post Note  Patient: Francisco Simmons.  Procedure(s) Performed: TOTAL KNEE ARTHROPLASTY (Left: Knee)     Patient location during evaluation: PACU Anesthesia Type: Regional and Spinal Level of consciousness: awake and alert Pain management: pain level controlled Vital Signs Assessment: post-procedure vital signs reviewed and stable Respiratory status: spontaneous breathing and respiratory function stable Cardiovascular status: blood pressure returned to baseline and stable Postop Assessment: spinal receding Anesthetic complications: no   No notable events documented.  Last Vitals:  Vitals:   07/26/21 1045 07/26/21 1100  BP: 132/82 (!) 143/90  Pulse: 71 68  Resp: (!) 22 20  Temp: 36.8 C 36.9 C  SpO2: 92% 93%    Last Pain:  Vitals:   07/26/21 0545  TempSrc: Oral                 Marcelle Bebout DANIEL

## 2021-07-26 NOTE — Anesthesia Procedure Notes (Signed)
Procedure Name: MAC Date/Time: 07/26/2021 7:24 AM Performed by: West Pugh, CRNA Pre-anesthesia Checklist: Patient identified, Emergency Drugs available, Suction available, Patient being monitored and Timeout performed Patient Re-evaluated:Patient Re-evaluated prior to induction Oxygen Delivery Method: Simple face mask Preoxygenation: Pre-oxygenation with 100% oxygen Induction Type: IV induction Placement Confirmation: positive ETCO2 Dental Injury: Teeth and Oropharynx as per pre-operative assessment

## 2021-07-26 NOTE — Evaluation (Addendum)
Physical Therapy Evaluation Patient Details Name: Francisco Simmons. MRN: [id number] DOB: [birth year] Today's Date: 07/26/2021  History of Present Illness  Patient is 70 y.o. male s/p Lt TKA on 07/26/21 with PMH significant for BPH, GERD, HTN, Rt TKA on 02/01/21.  Clinical Impression  Savino Whisenant. is a 70 y.o. male POD 0 s/p Lt TKA. Patient reports independence with mobility at baseline. Patient is now limited by functional impairments (see PT problem list below) and requires min guard/assist for transfers and gait with RW. Patient was limited by progressively increasing  BP with mobility (see below) and "funny feeling". Patient returned to supine where BP improved. Patient will benefit from continued skilled PT interventions to address impairments and progress towards PLOF. Acute PT will follow to progress mobility and stair training in preparation for safe discharge home.     HR in 50's at rest and pt became bradycardic with activity (low of 37bpm) Time              BP(mmHg)              Position 1246              119/72                      Supine 1251              163/129                    Seated 1252              170/152                    Standing 1255              129/83                      Supine 1300              135/82                      Supine    Recommendations for follow up therapy are one component of a multi-disciplinary discharge planning process, led by the attending physician.  Recommendations may be updated based on patient status, additional functional criteria and insurance authorization.  Follow Up Recommendations Follow surgeon's recommendation for DC plan and follow-up therapies    Equipment Recommendations  None recommended by PT    Recommendations for Other Services       Precautions / Restrictions Precautions Precautions: Fall Restrictions Weight Bearing Restrictions: No Other Position/Activity Restrictions: WBAT      Mobility  Bed  Mobility Overal bed mobility: Needs Assistance Bed Mobility: Supine to Sit;Sit to Supine     Supine to sit: HOB elevated;Min guard Sit to supine: Min assist;HOB elevated   General bed mobility comments: cues for sequencing, pt required extra time to raise trunk up. assist to bring LE's onto bed to return to supine.    Transfers Overall transfer level: Needs assistance Equipment used: Rolling walker (2 wheeled) Transfers: Sit to/from Stand Sit to Stand: From elevated surface;Min guard (stretcher)         General transfer comment: cues for hand placement at EOB and min guard to rise from stretcher. BP continued to elevate as pt progressed mobility and he reported funny feeling in head. returned to supne and BP returned to normal.  Ambulation/Gait                Stairs            Wheelchair Mobility    Modified Rankin (Stroke Patients Only)       Balance Overall balance assessment: Needs assistance Sitting-balance support: Feet supported Sitting balance-Leahy Scale: Good     Standing balance support: During functional activity;Bilateral upper extremity supported Standing balance-Leahy Scale: Fair                               Pertinent Vitals/Pain Pain Assessment: Faces Faces Pain Scale: Hurts little more Pain Location: Lt knee Pain Descriptors / Indicators: Aching;Discomfort Pain Intervention(s): Limited activity within patient's tolerance;Monitored during session;Repositioned    Home Living Family/patient expects to be discharged to:: Private residence Living Arrangements: Spouse/significant other Available Help at Discharge: Family;Available 24 hours/day Type of Home: House Home Access: Stairs to enter Entrance Stairs-Rails: Lawyer of Steps: 3 Home Layout: One level Home Equipment: Environmental consultant - 2 wheels      Prior Function Level of Independence: Independent               Hand Dominance   Dominant  Hand: Right    Extremity/Trunk Assessment   Upper Extremity Assessment Upper Extremity Assessment: Overall WFL for tasks assessed    Lower Extremity Assessment Lower Extremity Assessment: LLE deficits/detail LLE Deficits / Details: good quad activation, no extensor lag with SLR LLE Sensation: WNL LLE Coordination: WNL    Cervical / Trunk Assessment Cervical / Trunk Assessment: Normal  Communication   Communication: No difficulties  Cognition Arousal/Alertness: Awake/alert Behavior During Therapy: WFL for tasks assessed/performed Overall Cognitive Status: Within Functional Limits for tasks assessed                                        General Comments      Exercises     Assessment/Plan    PT Assessment Patient needs continued PT services  PT Problem List Decreased strength;Decreased range of motion;Decreased activity tolerance;Decreased balance;Decreased mobility;Decreased knowledge of use of DME;Decreased knowledge of precautions;Pain       PT Treatment Interventions DME instruction;Gait training;Stair training;Functional mobility training;Therapeutic activities;Therapeutic exercise;Balance training;Patient/family education    PT Goals (Current goals can be found in the Care Plan section)  Acute Rehab PT Goals Patient Stated Goal: recover safely PT Goal Formulation: With patient/family Time For Goal Achievement: 08/02/21 Potential to Achieve Goals: Good    Frequency 7X/week   Barriers to discharge        Co-evaluation               AM-PAC PT "6 Clicks" Mobility  Outcome Measure Help needed turning from your back to your side while in a flat bed without using bedrails?: None Help needed moving from lying on your back to sitting on the side of a flat bed without using bedrails?: A Little Help needed moving to and from a bed to a chair (including a wheelchair)?: A Little Help needed standing up from a chair using your arms (e.g.,  wheelchair or bedside chair)?: A Little Help needed to walk in hospital room?: A Little Help needed climbing 3-5 steps with a railing? : A Lot 6 Click Score: 18    End of Session Equipment Utilized During Treatment: Gait belt Activity Tolerance: Patient tolerated  treatment well Patient left: with family/visitor present;in bed;with call bell/phone within reach Nurse Communication: Mobility status PT Visit Diagnosis: Muscle weakness (generalized) (M62.81);Difficulty in walking, not elsewhere classified (R26.2);Pain Pain - Right/Left: Left Pain - part of body: Knee    Time: 1241-1300 PT Time Calculation (min) (ACUTE ONLY): 19 min   Charges:   PT Evaluation $PT Eval Low Complexity: 1 Low          Wynn Maudlin, DPT Acute Rehabilitation Services Office [phone] Pager [phone]   Anitra Lauth 07/26/2021, 2:59 PM

## 2021-07-26 NOTE — Anesthesia Procedure Notes (Signed)
Anesthesia Regional Block: Adductor canal block   Pre-Anesthetic Checklist: , timeout performed,  Correct Patient, Correct Site, Correct Laterality,  Correct Procedure, Correct Position, site marked,  Risks and benefits discussed,  Surgical consent,  Pre-op evaluation,  At surgeon's request and post-op pain management  Laterality: Left  Prep: chloraprep       Needles:  Injection technique: Single-shot  Needle Type: Stimulator Needle - 80     Needle Length: 10cm  Needle Gauge: 21     Additional Needles:   Narrative:  Start time: 07/26/2021 6:46 AM End time: 07/26/2021 6:56 AM Injection made incrementally with aspirations every 5 mL.  Performed by: Personally  Anesthesiologist: Heather Roberts, MD

## 2021-07-26 NOTE — Transfer of Care (Signed)
Immediate Anesthesia Transfer of Care Note  Patient: Francisco Simmons.  Procedure(s) Performed: TOTAL KNEE ARTHROPLASTY (Left: Knee)  Patient Location: PACU  Anesthesia Type:Spinal and MAC combined with regional for post-op pain  Level of Consciousness: awake, alert  and patient cooperative  Airway & Oxygen Therapy: Patient Spontanous Breathing and Patient connected to face mask oxygen  Post-op Assessment: Report given to RN and Post -op Vital signs reviewed and stable  Post vital signs: Reviewed and stable  Last Vitals:  Vitals Value Taken Time  BP    Temp    Pulse    Resp    SpO2      Last Pain:  Vitals:   07/26/21 0545  TempSrc: Oral         Complications: No notable events documented.

## 2021-07-26 NOTE — Progress Notes (Signed)
Orthopedic Tech Progress Note Patient Details:  Francisco Simmons [birth year] [id number]  CPM Left Knee CPM Left Knee: On Left Knee Flexion (Degrees): 90 Left Knee Extension (Degrees): 0 Additional Comments: foot roll  Post Interventions Patient Tolerated: Well Instructions Provided: Care of device  Saul Fordyce 07/26/2021, 10:28 AM

## 2021-07-27 ENCOUNTER — Encounter (HOSPITAL_COMMUNITY): Payer: Self-pay | Admitting: Orthopedic Surgery

## 2021-07-28 DIAGNOSIS — M25562 Pain in left knee: Secondary | ICD-10-CM | POA: Diagnosis not present

## 2021-07-28 DIAGNOSIS — M25662 Stiffness of left knee, not elsewhere classified: Secondary | ICD-10-CM | POA: Diagnosis not present

## 2021-07-28 DIAGNOSIS — M6281 Muscle weakness (generalized): Secondary | ICD-10-CM | POA: Diagnosis not present

## 2021-07-28 DIAGNOSIS — M1712 Unilateral primary osteoarthritis, left knee: Secondary | ICD-10-CM | POA: Diagnosis not present

## 2021-07-30 DIAGNOSIS — M6281 Muscle weakness (generalized): Secondary | ICD-10-CM | POA: Diagnosis not present

## 2021-07-30 DIAGNOSIS — M25662 Stiffness of left knee, not elsewhere classified: Secondary | ICD-10-CM | POA: Diagnosis not present

## 2021-07-30 DIAGNOSIS — M25562 Pain in left knee: Secondary | ICD-10-CM | POA: Diagnosis not present

## 2021-07-30 DIAGNOSIS — M1712 Unilateral primary osteoarthritis, left knee: Secondary | ICD-10-CM | POA: Diagnosis not present

## 2021-08-03 DIAGNOSIS — M6281 Muscle weakness (generalized): Secondary | ICD-10-CM | POA: Diagnosis not present

## 2021-08-03 DIAGNOSIS — M25562 Pain in left knee: Secondary | ICD-10-CM | POA: Diagnosis not present

## 2021-08-03 DIAGNOSIS — M25662 Stiffness of left knee, not elsewhere classified: Secondary | ICD-10-CM | POA: Diagnosis not present

## 2021-08-03 DIAGNOSIS — M1712 Unilateral primary osteoarthritis, left knee: Secondary | ICD-10-CM | POA: Diagnosis not present

## 2021-08-05 DIAGNOSIS — M1712 Unilateral primary osteoarthritis, left knee: Secondary | ICD-10-CM | POA: Diagnosis not present

## 2021-08-06 DIAGNOSIS — M6281 Muscle weakness (generalized): Secondary | ICD-10-CM | POA: Diagnosis not present

## 2021-08-06 DIAGNOSIS — M25662 Stiffness of left knee, not elsewhere classified: Secondary | ICD-10-CM | POA: Diagnosis not present

## 2021-08-06 DIAGNOSIS — M1712 Unilateral primary osteoarthritis, left knee: Secondary | ICD-10-CM | POA: Diagnosis not present

## 2021-08-10 DIAGNOSIS — M25562 Pain in left knee: Secondary | ICD-10-CM | POA: Diagnosis not present

## 2021-08-10 DIAGNOSIS — M1712 Unilateral primary osteoarthritis, left knee: Secondary | ICD-10-CM | POA: Diagnosis not present

## 2021-08-10 DIAGNOSIS — M6281 Muscle weakness (generalized): Secondary | ICD-10-CM | POA: Diagnosis not present

## 2021-08-10 DIAGNOSIS — M25662 Stiffness of left knee, not elsewhere classified: Secondary | ICD-10-CM | POA: Diagnosis not present

## 2021-08-12 DIAGNOSIS — M25562 Pain in left knee: Secondary | ICD-10-CM | POA: Diagnosis not present

## 2021-08-12 DIAGNOSIS — M6281 Muscle weakness (generalized): Secondary | ICD-10-CM | POA: Diagnosis not present

## 2021-08-12 DIAGNOSIS — M1712 Unilateral primary osteoarthritis, left knee: Secondary | ICD-10-CM | POA: Diagnosis not present

## 2021-08-12 DIAGNOSIS — M25662 Stiffness of left knee, not elsewhere classified: Secondary | ICD-10-CM | POA: Diagnosis not present

## 2021-08-18 DIAGNOSIS — M1712 Unilateral primary osteoarthritis, left knee: Secondary | ICD-10-CM | POA: Diagnosis not present

## 2021-08-18 DIAGNOSIS — M25662 Stiffness of left knee, not elsewhere classified: Secondary | ICD-10-CM | POA: Diagnosis not present

## 2021-08-18 DIAGNOSIS — M25562 Pain in left knee: Secondary | ICD-10-CM | POA: Diagnosis not present

## 2021-08-18 DIAGNOSIS — M6281 Muscle weakness (generalized): Secondary | ICD-10-CM | POA: Diagnosis not present

## 2021-08-19 DIAGNOSIS — M1712 Unilateral primary osteoarthritis, left knee: Secondary | ICD-10-CM | POA: Diagnosis not present

## 2021-08-20 DIAGNOSIS — M1712 Unilateral primary osteoarthritis, left knee: Secondary | ICD-10-CM | POA: Diagnosis not present

## 2021-08-20 DIAGNOSIS — M25562 Pain in left knee: Secondary | ICD-10-CM | POA: Diagnosis not present

## 2021-08-20 DIAGNOSIS — M6281 Muscle weakness (generalized): Secondary | ICD-10-CM | POA: Diagnosis not present

## 2021-08-20 DIAGNOSIS — M25662 Stiffness of left knee, not elsewhere classified: Secondary | ICD-10-CM | POA: Diagnosis not present

## 2021-08-25 DIAGNOSIS — M6281 Muscle weakness (generalized): Secondary | ICD-10-CM | POA: Diagnosis not present

## 2021-08-25 DIAGNOSIS — M25562 Pain in left knee: Secondary | ICD-10-CM | POA: Diagnosis not present

## 2021-08-25 DIAGNOSIS — Z96652 Presence of left artificial knee joint: Secondary | ICD-10-CM | POA: Diagnosis not present

## 2021-08-25 DIAGNOSIS — M1712 Unilateral primary osteoarthritis, left knee: Secondary | ICD-10-CM | POA: Diagnosis not present

## 2021-08-25 DIAGNOSIS — M25662 Stiffness of left knee, not elsewhere classified: Secondary | ICD-10-CM | POA: Diagnosis not present

## 2021-08-27 DIAGNOSIS — M6281 Muscle weakness (generalized): Secondary | ICD-10-CM | POA: Diagnosis not present

## 2021-08-27 DIAGNOSIS — M1712 Unilateral primary osteoarthritis, left knee: Secondary | ICD-10-CM | POA: Diagnosis not present

## 2021-08-27 DIAGNOSIS — M25662 Stiffness of left knee, not elsewhere classified: Secondary | ICD-10-CM | POA: Diagnosis not present

## 2021-08-27 DIAGNOSIS — M25562 Pain in left knee: Secondary | ICD-10-CM | POA: Diagnosis not present

## 2021-08-27 DIAGNOSIS — Z96652 Presence of left artificial knee joint: Secondary | ICD-10-CM | POA: Diagnosis not present

## 2021-08-30 DIAGNOSIS — M1712 Unilateral primary osteoarthritis, left knee: Secondary | ICD-10-CM | POA: Diagnosis not present

## 2021-08-30 DIAGNOSIS — Z96652 Presence of left artificial knee joint: Secondary | ICD-10-CM | POA: Diagnosis not present

## 2021-08-30 DIAGNOSIS — M25662 Stiffness of left knee, not elsewhere classified: Secondary | ICD-10-CM | POA: Diagnosis not present

## 2021-08-30 DIAGNOSIS — M25562 Pain in left knee: Secondary | ICD-10-CM | POA: Diagnosis not present

## 2021-08-30 DIAGNOSIS — M6281 Muscle weakness (generalized): Secondary | ICD-10-CM | POA: Diagnosis not present

## 2021-09-02 DIAGNOSIS — M25662 Stiffness of left knee, not elsewhere classified: Secondary | ICD-10-CM | POA: Diagnosis not present

## 2021-09-02 DIAGNOSIS — M25562 Pain in left knee: Secondary | ICD-10-CM | POA: Diagnosis not present

## 2021-09-02 DIAGNOSIS — M1712 Unilateral primary osteoarthritis, left knee: Secondary | ICD-10-CM | POA: Diagnosis not present

## 2021-09-02 DIAGNOSIS — M6281 Muscle weakness (generalized): Secondary | ICD-10-CM | POA: Diagnosis not present

## 2021-09-02 DIAGNOSIS — Z96652 Presence of left artificial knee joint: Secondary | ICD-10-CM | POA: Diagnosis not present

## 2021-09-06 DIAGNOSIS — E785 Hyperlipidemia, unspecified: Secondary | ICD-10-CM | POA: Diagnosis not present

## 2021-09-06 DIAGNOSIS — N189 Chronic kidney disease, unspecified: Secondary | ICD-10-CM | POA: Diagnosis not present

## 2021-09-06 DIAGNOSIS — I10 Essential (primary) hypertension: Secondary | ICD-10-CM | POA: Diagnosis not present

## 2021-09-07 DIAGNOSIS — M6281 Muscle weakness (generalized): Secondary | ICD-10-CM | POA: Diagnosis not present

## 2021-09-07 DIAGNOSIS — M1712 Unilateral primary osteoarthritis, left knee: Secondary | ICD-10-CM | POA: Diagnosis not present

## 2021-09-07 DIAGNOSIS — M25662 Stiffness of left knee, not elsewhere classified: Secondary | ICD-10-CM | POA: Diagnosis not present

## 2021-09-07 DIAGNOSIS — M25562 Pain in left knee: Secondary | ICD-10-CM | POA: Diagnosis not present

## 2021-09-07 DIAGNOSIS — Z96652 Presence of left artificial knee joint: Secondary | ICD-10-CM | POA: Diagnosis not present

## 2021-09-09 DIAGNOSIS — M25562 Pain in left knee: Secondary | ICD-10-CM | POA: Diagnosis not present

## 2021-09-09 DIAGNOSIS — M25662 Stiffness of left knee, not elsewhere classified: Secondary | ICD-10-CM | POA: Diagnosis not present

## 2021-09-09 DIAGNOSIS — M1712 Unilateral primary osteoarthritis, left knee: Secondary | ICD-10-CM | POA: Diagnosis not present

## 2021-09-09 DIAGNOSIS — M6281 Muscle weakness (generalized): Secondary | ICD-10-CM | POA: Diagnosis not present

## 2021-09-09 DIAGNOSIS — Z96652 Presence of left artificial knee joint: Secondary | ICD-10-CM | POA: Diagnosis not present

## 2021-09-14 DIAGNOSIS — Z96652 Presence of left artificial knee joint: Secondary | ICD-10-CM | POA: Diagnosis not present

## 2021-09-14 DIAGNOSIS — M6281 Muscle weakness (generalized): Secondary | ICD-10-CM | POA: Diagnosis not present

## 2021-09-14 DIAGNOSIS — M25662 Stiffness of left knee, not elsewhere classified: Secondary | ICD-10-CM | POA: Diagnosis not present

## 2021-09-14 DIAGNOSIS — M25562 Pain in left knee: Secondary | ICD-10-CM | POA: Diagnosis not present

## 2021-09-14 DIAGNOSIS — M1712 Unilateral primary osteoarthritis, left knee: Secondary | ICD-10-CM | POA: Diagnosis not present

## 2021-09-21 DIAGNOSIS — M1712 Unilateral primary osteoarthritis, left knee: Secondary | ICD-10-CM | POA: Diagnosis not present

## 2021-09-23 DIAGNOSIS — R972 Elevated prostate specific antigen [PSA]: Secondary | ICD-10-CM | POA: Diagnosis not present

## 2021-12-13 DIAGNOSIS — N189 Chronic kidney disease, unspecified: Secondary | ICD-10-CM | POA: Diagnosis not present

## 2021-12-13 DIAGNOSIS — I10 Essential (primary) hypertension: Secondary | ICD-10-CM | POA: Diagnosis not present

## 2021-12-13 DIAGNOSIS — R7303 Prediabetes: Secondary | ICD-10-CM | POA: Diagnosis not present

## 2021-12-13 DIAGNOSIS — N400 Enlarged prostate without lower urinary tract symptoms: Secondary | ICD-10-CM | POA: Diagnosis not present

## 2021-12-13 DIAGNOSIS — E785 Hyperlipidemia, unspecified: Secondary | ICD-10-CM | POA: Diagnosis not present

## 2022-03-14 DIAGNOSIS — N189 Chronic kidney disease, unspecified: Secondary | ICD-10-CM | POA: Diagnosis not present

## 2022-03-14 DIAGNOSIS — I10 Essential (primary) hypertension: Secondary | ICD-10-CM | POA: Diagnosis not present

## 2022-03-14 DIAGNOSIS — R7303 Prediabetes: Secondary | ICD-10-CM | POA: Diagnosis not present

## 2022-03-14 DIAGNOSIS — E785 Hyperlipidemia, unspecified: Secondary | ICD-10-CM | POA: Diagnosis not present

## 2022-03-23 DIAGNOSIS — M25551 Pain in right hip: Secondary | ICD-10-CM | POA: Diagnosis not present

## 2022-03-23 DIAGNOSIS — M5136 Other intervertebral disc degeneration, lumbar region: Secondary | ICD-10-CM | POA: Diagnosis not present

## 2022-03-31 DIAGNOSIS — M545 Low back pain, unspecified: Secondary | ICD-10-CM | POA: Diagnosis not present

## 2022-04-04 DIAGNOSIS — M545 Low back pain, unspecified: Secondary | ICD-10-CM | POA: Diagnosis not present

## 2022-04-05 ENCOUNTER — Other Ambulatory Visit: Payer: Self-pay | Admitting: Sports Medicine

## 2022-04-05 DIAGNOSIS — M5126 Other intervertebral disc displacement, lumbar region: Secondary | ICD-10-CM

## 2022-04-07 ENCOUNTER — Other Ambulatory Visit: Payer: Medicare HMO

## 2022-04-11 ENCOUNTER — Ambulatory Visit
Admission: RE | Admit: 2022-04-11 | Discharge: 2022-04-11 | Disposition: A | Discharge status: Home / Self Care | Payer: Medicare HMO | Source: Ambulatory Visit | Attending: Sports Medicine | Admitting: Sports Medicine

## 2022-04-11 DIAGNOSIS — M47817 Spondylosis without myelopathy or radiculopathy, lumbosacral region: Secondary | ICD-10-CM | POA: Diagnosis not present

## 2022-04-11 DIAGNOSIS — M5126 Other intervertebral disc displacement, lumbar region: Secondary | ICD-10-CM

## 2022-04-11 MED ORDER — IOPAMIDOL (ISOVUE-M 200) INJECTION 41%
1.0000 mL | Freq: Once | INTRAMUSCULAR | Status: AC
  Administered 2022-04-11: 1 mL via EPIDURAL

## 2022-04-11 MED ORDER — METHYLPREDNISOLONE ACETATE 40 MG/ML INJ SUSP (RADIOLOG
80.0000 mg | Freq: Once | INTRAMUSCULAR | Status: AC
  Administered 2022-04-11: 80 mg via EPIDURAL

## 2022-04-11 NOTE — Discharge Instructions (Signed)

## 2022-05-28 ENCOUNTER — Encounter (HOSPITAL_COMMUNITY): Payer: Self-pay

## 2022-05-28 ENCOUNTER — Ambulatory Visit (HOSPITAL_COMMUNITY)
Admission: EM | Admit: 2022-05-28 | Discharge: 2022-05-28 | Disposition: A | Discharge status: Home / Self Care | Payer: Medicare HMO | Attending: Family Medicine | Admitting: Family Medicine

## 2022-05-28 DIAGNOSIS — H1032 Unspecified acute conjunctivitis, left eye: Secondary | ICD-10-CM | POA: Diagnosis not present

## 2022-05-28 DIAGNOSIS — J069 Acute upper respiratory infection, unspecified: Secondary | ICD-10-CM

## 2022-05-28 MED ORDER — BENZONATATE 100 MG PO CAPS: 100.0000 mg | ORAL_CAPSULE | Freq: Three times a day (TID) | ORAL | 0 refills | Status: DC | PRN

## 2022-05-28 MED ORDER — GENTAMICIN SULFATE 0.3 % OP SOLN: 2.0000 [drp] | Freq: Three times a day (TID) | OPHTHALMIC | 0 refills | Status: AC

## 2022-05-28 NOTE — ED Triage Notes (Signed)
Pt reports cough and congestion x 1 week.

## 2022-05-28 NOTE — ED Provider Notes (Signed)
[facility]    CSN: [id number] Arrival date & time: 05/28/22  1007      History   Chief Complaint Chief Complaint  Patient presents with   Cough   Nasal Congestion    HPI Francisco Simmons. is a 71 y.o. male.    Cough  Here for history of sore throat, cough, and postnasal drainage that began 5/31.  He has not had any fever.  No shortness of breath or wheezing.  He has not had any nausea, vomiting, or diarrhea.  No headache.  The cough is mainly still bothering him some now and can be fairly spasmodic.  He has had some left eye irritation with some redness.  He did do a home COVID test yesterday that was negative.  He is vaccinated against COVID  Past medical history significant for hypertension, for which she takes medication Past Medical History:  Diagnosis Date   BPH (benign prostatic hypertrophy)    Complication of anesthesia    Hard to wake up after carpal tunnel surgery   GERD (gastroesophageal reflux disease)    Hypertension    Primary localized osteoarthritis of left knee    Primary localized osteoarthritis of right knee 01/19/2021   Wears glasses     Patient Active Problem List   Diagnosis Date Noted   Primary localized osteoarthritis of right knee 01/19/2021   Hypertension    Primary localized osteoarthritis of left knee    Benign prostatic hyperplasia     Past Surgical History:  Procedure Laterality Date   CARPAL TUNNEL RELEASE  2003   right   CARPAL TUNNEL RELEASE Left 04/15/2014   Procedure: LEFT CARPAL TUNNEL RELEASE;  Surgeon: Wyn Forster, MD;  Location: [facility];  Service: Orthopedics;  Laterality: Left;   COLONOSCOPY     INGUINAL HERNIA REPAIR  1992   left   KNEE ARTHROSCOPY  1990   right and left   TOTAL KNEE ARTHROPLASTY Right 02/01/2021   Procedure: TOTAL KNEE ARTHROPLASTY;  Surgeon: Salvatore Marvel, MD;  Location: [facility];  Service: Orthopedics;  Laterality: Right;   TOTAL KNEE ARTHROPLASTY  Left 07/26/2021   Procedure: TOTAL KNEE ARTHROPLASTY;  Surgeon: Salvatore Marvel, MD;  Location: [facility];  Service: Orthopedics;  Laterality: Left;       Home Medications    Prior to Admission medications   Medication Sig Start Date End Date Taking? Authorizing Provider  amLODipine (NORVASC) 10 MG tablet Take 10 mg by mouth daily.    [provider]  aspirin EC 81 MG tablet Take 81 mg by mouth daily. Swallow whole.    [provider]  atorvastatin (LIPITOR) 20 MG tablet Take 20 mg by mouth at bedtime. 12/22/20   [provider]  doxazosin (CARDURA) 2 MG tablet Take 2 mg by mouth every evening.    [provider]  losartan (COZAAR) 100 MG tablet Take 100 mg by mouth daily.    [provider]    Family History Family History  Problem Relation Age of Onset   Hypertension Mother    Lung cancer Father     Social History Social History   Tobacco Use   Smoking status: Former    Types: Cigarettes    Quit date: 04/14/1984    Years since quitting: 38.1   Smokeless tobacco: Never  Vaping Use   Vaping Use: Never used  Substance Use Topics   Alcohol use: Not Currently    Comment: RARE  YEARS AGO   Drug use: No     Allergies   Patient has no known allergies.   Review of Systems Review of Systems  Respiratory:  Positive for cough.      Physical Exam Triage Vital Signs ED Triage Vitals [05/28/22 1043]  Enc Vitals Group     BP (!) 156/102     Pulse Rate 78     Resp 18     Temp 98.7 F (37.1 C)     Temp Source Oral     SpO2 98 %     Weight      Height      Head Circumference      Peak Flow      Pain Score      Pain Loc      Pain Edu?      Excl. in GC?    No data found.  Updated Vital Signs BP (!) 156/102 (BP Location: Left Arm)   Pulse 78   Temp 98.7 F (37.1 C) (Oral)   Resp 18   SpO2 98%   Visual Acuity Right Eye Distance:   Left Eye Distance:   Bilateral Distance:    Right Eye Near:   Left Eye Near:     Bilateral Near:     Physical Exam Vitals reviewed.  Constitutional:      General: He is not in acute distress.    Appearance: He is not ill-appearing, toxic-appearing or diaphoretic.  HENT:     Right Ear: Tympanic membrane and ear canal normal.     Left Ear: Tympanic membrane and ear canal normal.     Nose: Nose normal.     Mouth/Throat:     Mouth: Mucous membranes are moist.     Comments: There is some clear mucus draining in the oropharynx.  No asymmetry and no tonsillar hypertrophy or erythema Eyes:     Extraocular Movements: Extraocular movements intact.     Pupils: Pupils are equal, round, and reactive to light.     Comments: Right conjunctiva is normal.  His left conjunctiva is injected.  The lids are not edematous  Cardiovascular:     Rate and Rhythm: Normal rate and regular rhythm.     Heart sounds: No murmur heard. Pulmonary:     Effort: Pulmonary effort is normal.     Breath sounds: No stridor. No wheezing, rhonchi or rales.  Musculoskeletal:     Cervical back: Neck supple.  Lymphadenopathy:     Cervical: No cervical adenopathy.  Skin:    Capillary Refill: Capillary refill takes less than 2 seconds.     Coloration: Skin is not jaundiced or pale.  Neurological:     General: No focal deficit present.     Mental Status: He is alert and oriented to person, place, and time.  Psychiatric:        Behavior: Behavior normal.      UC Treatments / Results  Labs (all labs ordered are listed, but only abnormal results are displayed) Labs Reviewed - No data to display  EKG   Radiology No results found.  Procedures Procedures (including critical care time)  Medications Ordered in UC Medications - No data to display  Initial Impression / Assessment and Plan / UC Course  I have reviewed the triage vital signs and the nursing notes.  Pertinent labs & imaging results that were available during my care of the patient were reviewed by me and considered in my medical  decision making (  see chart for details).     Chest exam is normal, and he is well-appearing today.  He is not tachycardic.  His blood pressure is 156/102.  We discussed further COVID testing, and decided against.  I am going to send in [facility] for his cough.  We discussed his continuing his Coricidin. Final Clinical Impressions(s) / UC Diagnoses   Final diagnoses:  None   Discharge Instructions   None    ED Prescriptions   None    PDMP not reviewed this encounter.   Zenia Resides, MD 05/28/22 1130

## 2022-05-28 NOTE — Discharge Instructions (Addendum)
Take benzonatate 100 mg, 1 tab every 8 hours as needed for cough.  Put gentamicin eyedrops in the left eye 3 times daily for 5 days  Make sure you drink enough fluids.  You can continue taking the Coricidin as needed.  A vaporizer in your bedroom may help

## 2022-06-13 DIAGNOSIS — I10 Essential (primary) hypertension: Secondary | ICD-10-CM | POA: Diagnosis not present

## 2022-06-13 DIAGNOSIS — R7303 Prediabetes: Secondary | ICD-10-CM | POA: Diagnosis not present

## 2022-06-13 DIAGNOSIS — N189 Chronic kidney disease, unspecified: Secondary | ICD-10-CM | POA: Diagnosis not present

## 2022-06-13 DIAGNOSIS — E785 Hyperlipidemia, unspecified: Secondary | ICD-10-CM | POA: Diagnosis not present

## 2022-06-30 ENCOUNTER — Telehealth: Payer: Self-pay | Admitting: *Deleted

## 2022-06-30 NOTE — Patient Outreach (Signed)
  Care Coordination   06/30/2022 Name: Francisco Simmons. MRN: [id number] DOB: [birth year]   Care Coordination Outreach Attempts:  An unsuccessful telephone outreach was attempted today to offer the patient information about available care coordination services as a benefit of their health plan.   Follow Up Plan:  Additional outreach attempts will be made to offer the patient care coordination information and services.   Encounter Outcome:  No Answer  Care Coordination Interventions Activated:  No   Care Coordination Interventions:  No, not indicated    Irving Shows [facility], BSN [facility] RN Care Coordinator [phone]

## 2022-07-01 ENCOUNTER — Telehealth: Payer: Self-pay | Admitting: *Deleted

## 2022-07-01 NOTE — Patient Outreach (Signed)
  Care Coordination   07/01/2022 Name: Francisco Simmons. MRN: [id number] DOB: 02-20-1951   Care Coordination Outreach Attempts:  A second unsuccessful outreach was attempted today to offer the patient with information about available care coordination services as a benefit of their health plan.     Follow Up Plan:  Additional outreach attempts will be made to offer the patient care coordination information and services.   Encounter Outcome:  No Answer  Care Coordination Interventions Activated:  No   Care Coordination Interventions:  No, not indicated    Irving Shows [facility], BSN [facility] RN Care Coordinator [phone]

## 2022-07-04 ENCOUNTER — Telehealth: Payer: Self-pay | Admitting: *Deleted

## 2022-07-04 NOTE — Patient Outreach (Signed)
  Care Coordination   07/04/2022 Name: Francisco Simmons. MRN: [id number] DOB: 1951/10/21   Care Coordination Outreach Attempts:  A third unsuccessful outreach was attempted today to offer the patient with information about available care coordination services as a benefit of their health plan.   Follow Up Plan:  No further outreach attempts will be made at this time. We have been unable to contact the patient to offer or enroll patient in care coordination services  Encounter Outcome:  No Answer  Care Coordination Interventions Activated:  No   Care Coordination Interventions:  No, not indicated    Irving Shows [facility], BSN [facility] RN Care Coordinator [phone]

## 2022-07-24 IMAGING — XA Imaging study
2 series · 2 of 2 positions shown · non-contrast
Comparison: none

CLINICAL DATA: Lumbosacral spondylosis without myelopathy. Chronic
right lower back hip pain. No prior injections or surgery.

[Series 1: ortho standard · 1 of 1 slices shown (1 of 2)]
[im 1/1]
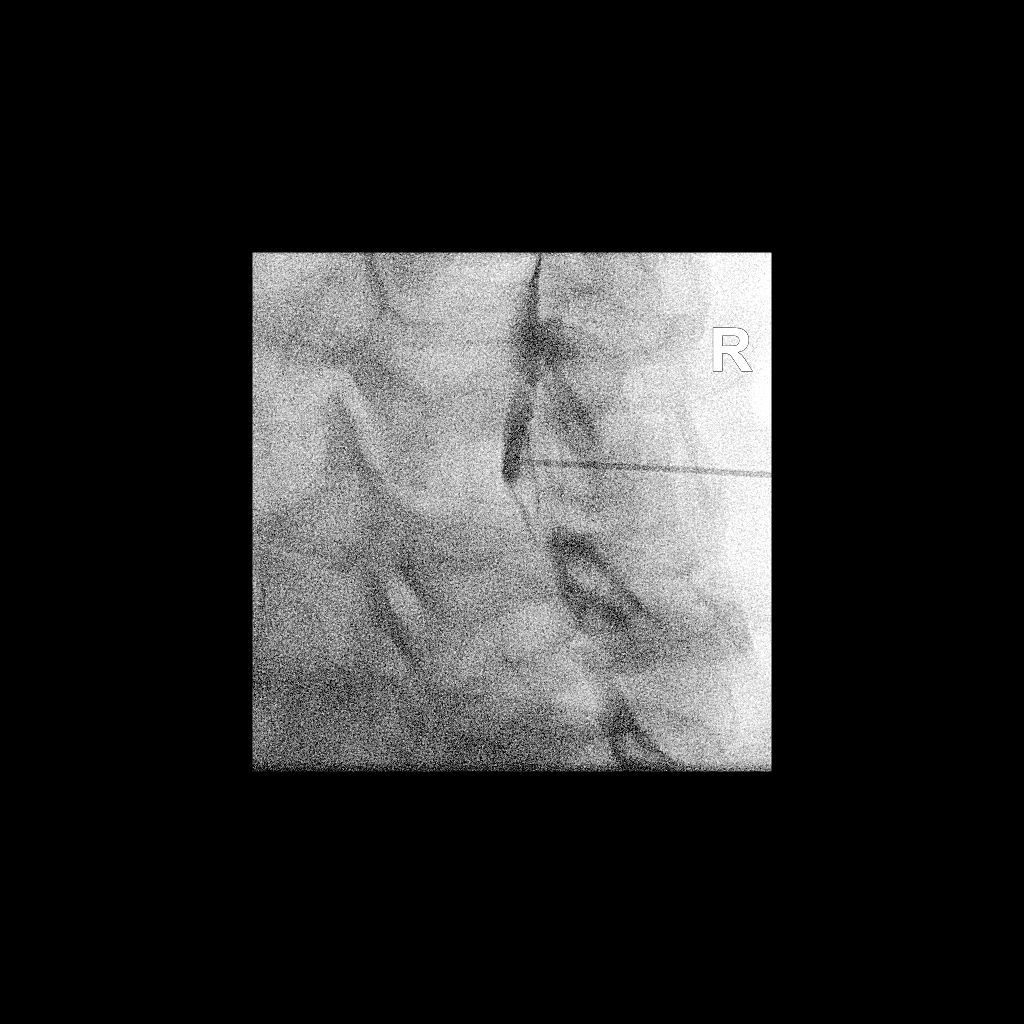

[Series 2: ortho standard · 1 of 1 slices shown (2 of 2)]
[im 1/1]
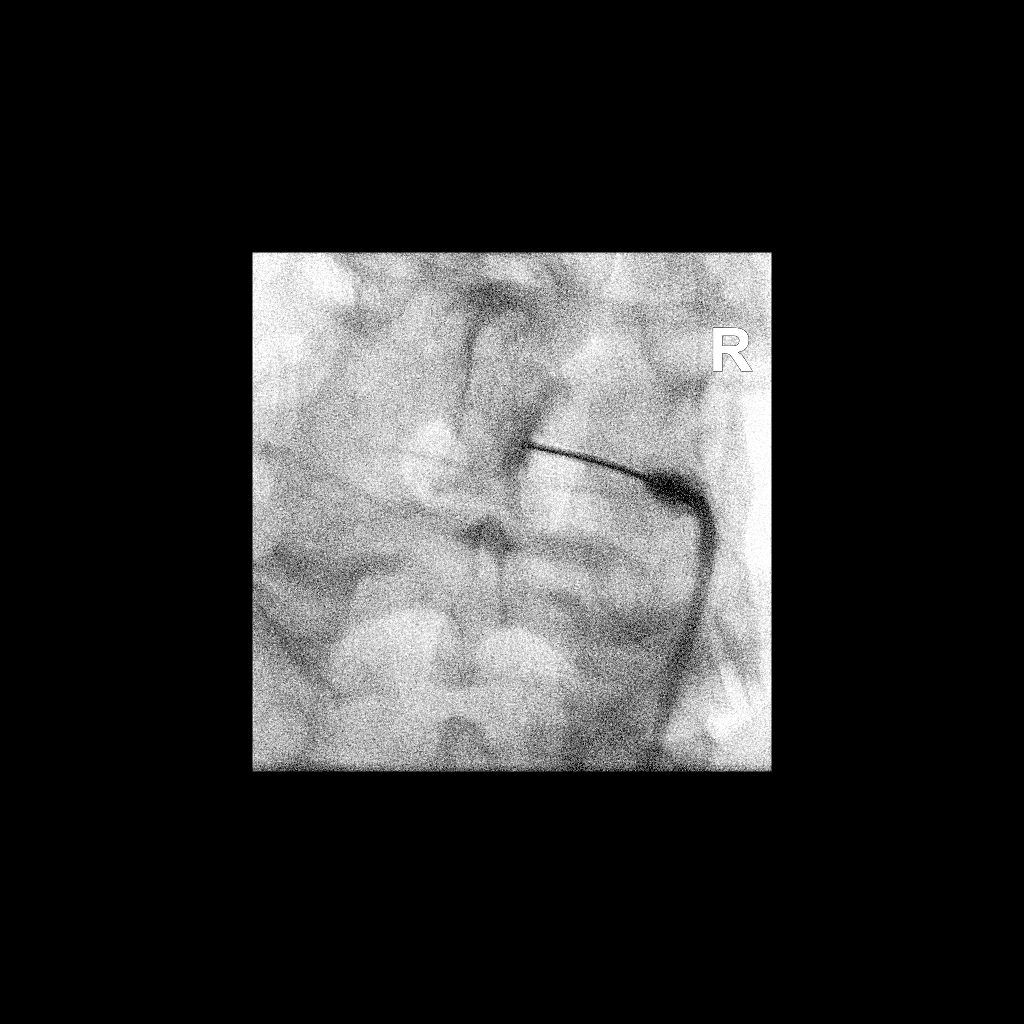

[2 of 2 positions shown; findings below may reference images not displayed]

FLUOROSCOPY:
Radiation Exposure Index (as provided by the fluoroscopic device):
1.4 mGy Kerma

PROCEDURE:
The procedure, risks, benefits, and alternatives were explained to
the patient. Questions regarding the procedure were encouraged and
answered. The patient understands and consents to the procedure.

LUMBAR EPIDURAL INJECTION:

An interlaminar approach was performed on the right at L4-L5. The
overlying skin was cleansed and anesthetized. A 3.5 inch 20 gauge
epidural needle was advanced using loss-of-resistance technique.

DIAGNOSTIC EPIDURAL INJECTION:

Injection of Isovue-M 200 shows a good epidural pattern with spread
above and below the level of needle placement, primarily on the
right. No vascular opacification is seen.

THERAPEUTIC EPIDURAL INJECTION:

80 mg of Depo-Medrol mixed with 3 mL of 1% lidocaine were instilled.
The procedure was well-tolerated, and the patient was discharged
thirty minutes following the injection in good condition.

COMPLICATIONS:
None immediate.
IMPRESSION: Technically successful interlaminar epidural injection on the right
at L4-L5.

## 2022-08-25 DIAGNOSIS — Z86010 Personal history of colonic polyps: Secondary | ICD-10-CM | POA: Diagnosis not present

## 2022-08-25 DIAGNOSIS — K219 Gastro-esophageal reflux disease without esophagitis: Secondary | ICD-10-CM | POA: Diagnosis not present

## 2022-08-25 DIAGNOSIS — Z1211 Encounter for screening for malignant neoplasm of colon: Secondary | ICD-10-CM | POA: Diagnosis not present

## 2022-08-25 DIAGNOSIS — K449 Diaphragmatic hernia without obstruction or gangrene: Secondary | ICD-10-CM | POA: Diagnosis not present

## 2022-09-07 DIAGNOSIS — E785 Hyperlipidemia, unspecified: Secondary | ICD-10-CM | POA: Diagnosis not present

## 2022-09-07 DIAGNOSIS — I10 Essential (primary) hypertension: Secondary | ICD-10-CM | POA: Diagnosis not present

## 2022-09-12 DIAGNOSIS — N189 Chronic kidney disease, unspecified: Secondary | ICD-10-CM | POA: Diagnosis not present

## 2022-09-12 DIAGNOSIS — E785 Hyperlipidemia, unspecified: Secondary | ICD-10-CM | POA: Diagnosis not present

## 2022-09-12 DIAGNOSIS — I10 Essential (primary) hypertension: Secondary | ICD-10-CM | POA: Diagnosis not present

## 2022-09-12 DIAGNOSIS — R7303 Prediabetes: Secondary | ICD-10-CM | POA: Diagnosis not present

## 2022-12-21 DIAGNOSIS — Z09 Encounter for follow-up examination after completed treatment for conditions other than malignant neoplasm: Secondary | ICD-10-CM | POA: Diagnosis not present

## 2022-12-21 DIAGNOSIS — Z1211 Encounter for screening for malignant neoplasm of colon: Secondary | ICD-10-CM | POA: Diagnosis not present

## 2022-12-21 DIAGNOSIS — K648 Other hemorrhoids: Secondary | ICD-10-CM | POA: Diagnosis not present

## 2022-12-21 DIAGNOSIS — Z86010 Personal history of colonic polyps: Secondary | ICD-10-CM | POA: Diagnosis not present

## 2023-02-14 DIAGNOSIS — H2513 Age-related nuclear cataract, bilateral: Secondary | ICD-10-CM | POA: Diagnosis not present

## 2023-02-14 DIAGNOSIS — Z0100 Encounter for examination of eyes and vision without abnormal findings: Secondary | ICD-10-CM | POA: Diagnosis not present

## 2023-03-13 DIAGNOSIS — I10 Essential (primary) hypertension: Secondary | ICD-10-CM | POA: Diagnosis not present

## 2023-03-13 DIAGNOSIS — R7303 Prediabetes: Secondary | ICD-10-CM | POA: Diagnosis not present

## 2023-03-13 DIAGNOSIS — E782 Mixed hyperlipidemia: Secondary | ICD-10-CM | POA: Diagnosis not present

## 2023-03-13 DIAGNOSIS — N529 Male erectile dysfunction, unspecified: Secondary | ICD-10-CM | POA: Diagnosis not present

## 2023-03-22 DIAGNOSIS — M545 Low back pain, unspecified: Secondary | ICD-10-CM | POA: Diagnosis not present

## 2023-03-22 DIAGNOSIS — M25551 Pain in right hip: Secondary | ICD-10-CM | POA: Diagnosis not present

## 2023-04-03 ENCOUNTER — Other Ambulatory Visit: Payer: Self-pay | Admitting: Sports Medicine

## 2023-04-03 DIAGNOSIS — M5126 Other intervertebral disc displacement, lumbar region: Secondary | ICD-10-CM

## 2023-04-06 ENCOUNTER — Other Ambulatory Visit: Payer: Medicare HMO

## 2023-04-10 NOTE — Discharge Instructions (Signed)

## 2023-04-11 ENCOUNTER — Ambulatory Visit
Admission: RE | Admit: 2023-04-11 | Discharge: 2023-04-11 | Disposition: A | Discharge status: Home / Self Care | Payer: Medicare HMO | Source: Ambulatory Visit | Attending: Sports Medicine | Admitting: Sports Medicine

## 2023-04-11 DIAGNOSIS — M4727 Other spondylosis with radiculopathy, lumbosacral region: Secondary | ICD-10-CM | POA: Diagnosis not present

## 2023-04-11 DIAGNOSIS — M5126 Other intervertebral disc displacement, lumbar region: Secondary | ICD-10-CM

## 2023-04-11 MED ORDER — IOPAMIDOL (ISOVUE-M 200) INJECTION 41%
1.0000 mL | Freq: Once | INTRAMUSCULAR | Status: AC
  Administered 2023-04-11: 1 mL via EPIDURAL

## 2023-04-11 MED ORDER — METHYLPREDNISOLONE ACETATE 40 MG/ML INJ SUSP (RADIOLOG
80.0000 mg | Freq: Once | INTRAMUSCULAR | Status: AC
  Administered 2023-04-11: 80 mg via EPIDURAL

## 2023-04-14 DIAGNOSIS — E876 Hypokalemia: Secondary | ICD-10-CM | POA: Diagnosis not present

## 2023-04-14 DIAGNOSIS — E559 Vitamin D deficiency, unspecified: Secondary | ICD-10-CM | POA: Diagnosis not present

## 2023-04-14 DIAGNOSIS — I10 Essential (primary) hypertension: Secondary | ICD-10-CM | POA: Diagnosis not present

## 2023-04-14 DIAGNOSIS — Z0000 Encounter for general adult medical examination without abnormal findings: Secondary | ICD-10-CM | POA: Diagnosis not present

## 2023-04-14 DIAGNOSIS — Z6826 Body mass index (BMI) 26.0-26.9, adult: Secondary | ICD-10-CM | POA: Diagnosis not present

## 2023-04-14 DIAGNOSIS — E663 Overweight: Secondary | ICD-10-CM | POA: Diagnosis not present

## 2023-04-14 DIAGNOSIS — R7303 Prediabetes: Secondary | ICD-10-CM | POA: Diagnosis not present

## 2023-04-14 DIAGNOSIS — Z79899 Other long term (current) drug therapy: Secondary | ICD-10-CM | POA: Diagnosis not present

## 2023-04-14 DIAGNOSIS — E785 Hyperlipidemia, unspecified: Secondary | ICD-10-CM | POA: Diagnosis not present

## 2024-05-28 ENCOUNTER — Ambulatory Visit
Admission: RE | Admit: 2024-05-28 | Discharge: 2024-05-28 | Disposition: A | Discharge status: Home / Self Care | Source: Ambulatory Visit | Attending: Family Medicine | Admitting: Family Medicine

## 2024-05-28 ENCOUNTER — Other Ambulatory Visit: Payer: Self-pay | Admitting: Family Medicine

## 2024-05-28 DIAGNOSIS — R6889 Other general symptoms and signs: Secondary | ICD-10-CM

## 2024-07-30 ENCOUNTER — Encounter: Payer: Self-pay | Admitting: Urology

## 2024-07-30 ENCOUNTER — Other Ambulatory Visit: Payer: Self-pay | Admitting: Urology

## 2024-07-30 DIAGNOSIS — R972 Elevated prostate specific antigen [PSA]: Secondary | ICD-10-CM

## 2024-07-31 NOTE — Progress Notes (Unsigned)
 Cardiology Office Note:    Date:  07/31/2024   ID:  Francisco Simmons Francisco Simmons., DOB [birth year], MRN [id number]  PCP:  Levern Hutching, MD   [facility] Providers Cardiologist:  None { Click to update primary MD,subspecialty MD or APP then REFRESH:1}    Referring MD: Levern Hutching, MD   No chief complaint on file. ***  History of Present Illness:    Francisco Simmons. is a 73 y.o. male seen at the request of Dr Maree for evaluation of fatigue. He has a history of HTN, HLD and prediabetes.   Past Medical History:  Diagnosis Date   BPH (benign prostatic hypertrophy)    CKD (chronic kidney disease), stage II    Complication of anesthesia    Hard to wake up after carpal tunnel surgery   GERD (gastroesophageal reflux disease)    HLD (hyperlipidemia)    Hypertension    Long term use of drug    Prediabetes    Primary localized osteoarthritis of left knee    Primary localized osteoarthritis of right knee 01/19/2021   Wears glasses     Past Surgical History:  Procedure Laterality Date   CARPAL TUNNEL RELEASE  2003   right   CARPAL TUNNEL RELEASE Left 04/15/2014   Procedure: LEFT CARPAL TUNNEL RELEASE;  Surgeon: Lamar Leonor Simmons LULLA, MD;  Location: [facility];  Service: Orthopedics;  Laterality: Left;   COLONOSCOPY     INGUINAL HERNIA REPAIR  1992   left   KNEE ARTHROSCOPY  1990   right and left   TOTAL KNEE ARTHROPLASTY Right 02/01/2021   Procedure: TOTAL KNEE ARTHROPLASTY;  Surgeon: Jane Lamar, MD;  Location: [facility];  Service: Orthopedics;  Laterality: Right;   TOTAL KNEE ARTHROPLASTY Left 07/26/2021   Procedure: TOTAL KNEE ARTHROPLASTY;  Surgeon: Jane Lamar, MD;  Location: [facility];  Service: Orthopedics;  Laterality: Left;    Current Medications: No outpatient medications have been marked as taking for the 08/02/24 encounter (Appointment) with [place], Yuvin Bussiere M, MD.     Allergies:   Patient has no known allergies.   Social History   Socioeconomic  History   Marital status: Married    Spouse name: Lauraine   Number of children: 4   Years of education: Not on file   Highest education level: Not on file  Occupational History   Not on file  Tobacco Use   Smoking status: Former    Current packs/day: 0.00    Types: Cigarettes    Quit date: 04/14/1984    Years since quitting: 40.3   Smokeless tobacco: Never  Vaping Use   Vaping status: Never Used  Substance and Sexual Activity   Alcohol use: Not Currently    Comment: RARE YEARS AGO   Drug use: No   Sexual activity: Not on file  Other Topics Concern   Not on file  Social History Narrative   Not on file   Social Drivers of Health   Financial Resource Strain: Not on file  Food Insecurity: Not on file  Transportation Needs: Not on file  Physical Activity: Not on file  Stress: Not on file  Social Connections: Not on file     Family History: The patient's ***family history includes Hypertension in his mother; Lung cancer in his father.  ROS:   Please see the history of present illness.    *** All other systems reviewed and are negative.  EKGs/Labs/Other Studies Reviewed:    The following studies  were reviewed today: ***      Recent Labs: No results found for requested labs within last 365 days.  Recent Lipid Panel No results found for: CHOL, TRIG, HDL, CHOLHDL, VLDL, LDLCALC, LDLDIRECT   Risk Assessment/Calculations:   {Does this patient have ATRIAL FIBRILLATION?[phone]}  No BP recorded.  {Refresh Note OR Click here to enter BP  :1}***         Physical Exam:    VS:  There were no vitals taken for this visit.    Wt Readings from Last 3 Encounters:  07/06/21 212 lb 6.4 oz (96.3 kg)  07/14/21 210 lb (95.3 kg)  02/06/21 212 lb 15.4 oz (96.6 kg)     GEN: *** Well nourished, well developed in no acute distress HEENT: Normal NECK: No JVD; No carotid bruits LYMPHATICS: No lymphadenopathy CARDIAC: ***RRR, no murmurs, rubs,  gallops RESPIRATORY:  Clear to auscultation without rales, wheezing or rhonchi  ABDOMEN: Soft, non-tender, non-distended MUSCULOSKELETAL:  No edema; No deformity  SKIN: Warm and dry NEUROLOGIC:  Alert and oriented x 3 PSYCHIATRIC:  Normal affect   ASSESSMENT:    No diagnosis found. PLAN:    In order of problems listed above:  ***      {Are you ordering a CV Procedure (e.g. stress test, cath, DCCV, TEE, etc)?   Press F2        :[id number]}    Medication Adjustments/Labs and Tests Ordered: Current medicines are reviewed at length with the patient today.  Concerns regarding medicines are outlined above.  No orders of the defined types were placed in this encounter.  No orders of the defined types were placed in this encounter.   There are no Patient Instructions on file for this visit.   Signed, Satara Virella [place], MD  07/31/2024 12:55 PM    [facility]

## 2024-08-02 ENCOUNTER — Ambulatory Visit: Attending: Cardiology | Admitting: Cardiology

## 2024-08-02 ENCOUNTER — Encounter (HOSPITAL_COMMUNITY): Payer: Self-pay | Admitting: *Deleted

## 2024-08-02 ENCOUNTER — Encounter: Payer: Self-pay | Admitting: Cardiology

## 2024-08-02 VITALS — BP 131/82 | HR 60 | Ht 74.0 in | Wt 216.2 lb

## 2024-08-02 DIAGNOSIS — T733XXA Exhaustion due to excessive exertion, initial encounter: Secondary | ICD-10-CM

## 2024-08-02 DIAGNOSIS — I10 Essential (primary) hypertension: Secondary | ICD-10-CM

## 2024-08-02 DIAGNOSIS — T733XXD Exhaustion due to excessive exertion, subsequent encounter: Secondary | ICD-10-CM | POA: Diagnosis not present

## 2024-08-02 NOTE — Patient Instructions (Signed)
 Medication Instructions:  Continue same medications   Lab Work: None ordered  Testing/Procedures: Treadmill  Do Not eat or drink 3 hours before  you may have water  No Caffeine 12 hours before Wear comfortable clothes and walking shoes No Lotion No Cologne    Follow-Up: At [facility], you and your health needs are our priority.  As part of our continuing mission to provide you with exceptional heart care, our providers are all part of one team.  This team includes your primary Cardiologist (physician) and Advanced Practice Providers or APPs (Physician Assistants and Nurse Practitioners) who all work together to provide you with the care you need, when you need it.  Your next appointment:  To Be Determined    Provider:  Dr.Jordan   We recommend signing up for the patient portal called MyChart.  Sign up information is provided on this After Visit Summary.  MyChart is used to connect with patients for Virtual Visits (Telemedicine).  Patients are able to view lab/test results, encounter notes, upcoming appointments, etc.  Non-urgent messages can be sent to your provider as well.   To learn more about what you can do with MyChart, go to [email or web address].

## 2024-08-22 ENCOUNTER — Ambulatory Visit (HOSPITAL_COMMUNITY)
Admission: RE | Admit: 2024-08-22 | Discharge: 2024-08-22 | Disposition: A | Discharge status: Home / Self Care | Source: Ambulatory Visit | Attending: Internal Medicine | Admitting: Internal Medicine

## 2024-08-22 DIAGNOSIS — I10 Essential (primary) hypertension: Secondary | ICD-10-CM | POA: Diagnosis present

## 2024-08-22 DIAGNOSIS — T733XXA Exhaustion due to excessive exertion, initial encounter: Secondary | ICD-10-CM | POA: Diagnosis present

## 2024-08-22 LAB — EXERCISE TOLERANCE TEST
Angina Index: 0
Duke Treadmill Score: 2
Estimated workload: 8.8
Exercise duration (min): 7 min
Exercise duration (sec): 13 s
MPHR: 146 {beats}/min
Peak HR: 125 {beats}/min
Percent HR: 85 %
Rest HR: 68 {beats}/min
ST Depression (mm): 1 mm

## 2024-08-23 ENCOUNTER — Ambulatory Visit: Payer: Self-pay | Admitting: Cardiology

## 2024-08-28 ENCOUNTER — Telehealth: Payer: Self-pay

## 2024-08-28 ENCOUNTER — Other Ambulatory Visit: Payer: Self-pay

## 2024-08-28 DIAGNOSIS — Z01812 Encounter for preprocedural laboratory examination: Secondary | ICD-10-CM

## 2024-08-28 DIAGNOSIS — R9439 Abnormal result of other cardiovascular function study: Secondary | ICD-10-CM

## 2024-08-28 MED ORDER — METOPROLOL TARTRATE 25 MG PO TABS: ORAL_TABLET | ORAL | 0 refills | Status: DC

## 2024-08-28 NOTE — Telephone Encounter (Signed)
 Your cardiac CT will be scheduled at one of the below locations:   [facility] [address] [place], [place] [address] [phone] (Severe contrast allergies only)  OR   [facility] [address] [place], [place] [address] [phone]  OR   [facility] [address] [place], [place] [address] [phone]  OR   [facility]. [facility] and Vascular Tower [address]  [place], [place] [address]  OR   [facility] [address] [place], [place] [phone]  If scheduled at [facility], please arrive at the [facility] and Children's Entrance (Entrance C2) of [facility] 30 minutes prior to test start time. You can use the FREE valet parking offered at entrance C (encouraged to control the heart rate for the test)  Proceed to the [facility] Radiology Department (first floor) to check-in and test prep.  All radiology patients and guests should use entrance C2 at [facility], accessed from [facility], even though the hospital's physical address listed is [address]  If scheduled at the Heart and Vascular Tower at [address], please enter the parking lot using the [address] entrance and use the FREE valet service at the patient drop-off area. Enter the building and check-in with registration on the main floor.  If scheduled at [facility], please arrive to the Heart and Vascular Center 15 mins early for check-in and test prep.  There is spacious parking and easy access to the radiology department from the [facility] Heart and Vascular entrance. Please enter here and check-in with the desk attendant.   If scheduled at [facility], please arrive 30 minutes early for check-in and test prep.  Please follow these instructions carefully (unless otherwise directed):  An IV will be required for this test and Nitroglycerin will be  given.  Hold all erectile dysfunction medications at least 3 days (72 hrs) prior to test. (Ie viagra, cialis, sildenafil, tadalafil, etc)   On the Night Before the Test: Be sure to Drink plenty of water . Do not consume any caffeinated/decaffeinated beverages or chocolate 12 hours prior to your test. Do not take any antihistamines 12 hours prior to your test.    On the Day of the Test: Drink plenty of water  until 1 hour prior to the test. Do not eat any food 1 hour prior to test. You may take your regular medications prior to the test.  Take metoprolol 25 mg two hours prior to test. If you take Furosemide/Hydrochlorothiazide/Spironolactone/Chlorthalidone, please HOLD on the morning of the test.         After the Test: Drink plenty of water . After receiving IV contrast, you may experience a mild flushed feeling. This is normal. On occasion, you may experience a mild rash up to 24 hours after the test. This is not dangerous. If this occurs, you can take Benadryl  25 mg, Zyrtec , Claritin, or Allegra and increase your fluid intake. (Patients taking Tikosyn should avoid Benadryl , and may take Zyrtec , Claritin, or Allegra) If you experience trouble breathing, this can be serious. If it is severe call 911 IMMEDIATELY. If it is mild, please call our office.  We will call to schedule your test 2-4 weeks out understanding that some insurance companies will need an authorization prior to the service being performed.   For more information and frequently asked questions, please visit our website : [email or web address]  For non-scheduling related questions,  please contact the cardiac imaging nurse navigator should you have any questions/concerns: Cardiac Imaging Nurse Navigators Direct Office Dial: [phone]   For scheduling needs, including cancellations and rescheduling, please call Brittany, [phone].

## 2024-08-31 ENCOUNTER — Ambulatory Visit: Payer: Self-pay | Admitting: Cardiology

## 2024-08-31 LAB — BASIC METABOLIC PANEL WITH GFR
BUN/Creatinine Ratio: 13 (ref 10–24)
BUN: 16 mg/dL (ref 8–27)
CO2: 25 mmol/L (ref 20–29)
Calcium: 9.1 mg/dL (ref 8.6–10.2)
Chloride: 103 mmol/L (ref 96–106)
Creatinine, Ser: 1.21 mg/dL (ref 0.76–1.27)
Glucose: 96 mg/dL (ref 70–99)
Potassium: 3.4 mmol/L — ABNORMAL LOW (ref 3.5–5.2)
Sodium: 142 mmol/L (ref 134–144)
eGFR: 63 mL/min/1.73 (ref >59)

## 2024-09-03 ENCOUNTER — Other Ambulatory Visit: Payer: Self-pay

## 2024-09-06 ENCOUNTER — Ambulatory Visit
Admission: RE | Admit: 2024-09-06 | Discharge: 2024-09-06 | Disposition: A | Discharge status: Home / Self Care | Source: Ambulatory Visit | Attending: Urology | Admitting: Urology

## 2024-09-06 DIAGNOSIS — R972 Elevated prostate specific antigen [PSA]: Secondary | ICD-10-CM

## 2024-09-06 MED ORDER — GADOPICLENOL 0.5 MMOL/ML IV SOLN
10.0000 mL | Freq: Once | INTRAVENOUS | Status: AC | PRN
  Administered 2024-09-06: 10 mL via INTRAVENOUS

## 2024-09-16 ENCOUNTER — Ambulatory Visit (HOSPITAL_COMMUNITY)
Admission: RE | Admit: 2024-09-16 | Discharge: 2024-09-16 | Disposition: A | Discharge status: Home / Self Care | Source: Ambulatory Visit | Attending: Cardiology | Admitting: Cardiology

## 2024-09-16 DIAGNOSIS — R9430 Abnormal result of cardiovascular function study, unspecified: Secondary | ICD-10-CM | POA: Diagnosis not present

## 2024-09-16 DIAGNOSIS — I2510 Atherosclerotic heart disease of native coronary artery without angina pectoris: Secondary | ICD-10-CM | POA: Insufficient documentation

## 2024-09-16 DIAGNOSIS — R9439 Abnormal result of other cardiovascular function study: Secondary | ICD-10-CM | POA: Diagnosis present

## 2024-09-16 DIAGNOSIS — I77810 Thoracic aortic ectasia: Secondary | ICD-10-CM | POA: Diagnosis not present

## 2024-09-16 MED ORDER — NITROGLYCERIN 0.4 MG SL SUBL
0.8000 mg | SUBLINGUAL_TABLET | Freq: Once | SUBLINGUAL | Status: AC
  Administered 2024-09-16: 0.8 mg via SUBLINGUAL

## 2024-09-16 MED ORDER — IOHEXOL 350 MG/ML SOLN
100.0000 mL | Freq: Once | INTRAVENOUS | Status: AC | PRN
  Administered 2024-09-16: 100 mL via INTRAVENOUS

## 2024-09-30 ENCOUNTER — Other Ambulatory Visit: Payer: Self-pay

## 2024-09-30 ENCOUNTER — Telehealth: Payer: Self-pay | Admitting: Cardiology

## 2024-09-30 DIAGNOSIS — R911 Solitary pulmonary nodule: Secondary | ICD-10-CM

## 2024-09-30 NOTE — Telephone Encounter (Signed)
 Caller (Diane) called to report to Dr. Jordan that patient's results were amended today and called attention to Impression, #3.

## 2024-09-30 NOTE — Telephone Encounter (Signed)
 Dr jordan has already seen the results. See CT scan for recommendations.

## 2024-10-10 ENCOUNTER — Other Ambulatory Visit: Payer: Self-pay

## 2024-10-10 DIAGNOSIS — R911 Solitary pulmonary nodule: Secondary | ICD-10-CM

## 2024-10-31 ENCOUNTER — Encounter (HOSPITAL_COMMUNITY)
Admission: RE | Admit: 2024-10-31 | Discharge: 2024-10-31 | Disposition: A | Source: Ambulatory Visit | Attending: Cardiology | Admitting: Cardiology

## 2024-10-31 DIAGNOSIS — R911 Solitary pulmonary nodule: Secondary | ICD-10-CM | POA: Diagnosis present

## 2024-10-31 LAB — GLUCOSE, CAPILLARY: Glucose-Capillary: 100 mg/dL — ABNORMAL HIGH (ref 70–99)

## 2024-10-31 MED ORDER — FLUDEOXYGLUCOSE F - 18 (FDG) INJECTION: 10.8000 | Freq: Once | INTRAVENOUS | Status: DC | PRN

## 2024-10-31 MED ORDER — FLUDEOXYGLUCOSE F - 18 (FDG) INJECTION: 10.7000 | Freq: Once | INTRAVENOUS | Status: DC | PRN

## 2024-11-08 ENCOUNTER — Ambulatory Visit: Payer: Self-pay | Admitting: Cardiology

## 2024-11-08 ENCOUNTER — Other Ambulatory Visit: Payer: Self-pay

## 2024-11-08 DIAGNOSIS — K148 Other diseases of tongue: Secondary | ICD-10-CM

## 2024-11-13 ENCOUNTER — Ambulatory Visit (INDEPENDENT_AMBULATORY_CARE_PROVIDER_SITE_OTHER): Admitting: Otolaryngology

## 2024-11-13 ENCOUNTER — Encounter (INDEPENDENT_AMBULATORY_CARE_PROVIDER_SITE_OTHER): Payer: Self-pay | Admitting: Otolaryngology

## 2024-11-13 VITALS — BP 151/91 | HR 75 | Ht 74.0 in | Wt 216.0 lb

## 2024-11-13 DIAGNOSIS — J392 Other diseases of pharynx: Secondary | ICD-10-CM

## 2024-11-13 DIAGNOSIS — J358 Other chronic diseases of tonsils and adenoids: Secondary | ICD-10-CM | POA: Diagnosis not present

## 2024-11-13 NOTE — Patient Instructions (Signed)
 Hold aspirin  81mg  1 week before surgery

## 2024-11-13 NOTE — Progress Notes (Signed)
 Dear Dr. Jordan, Here is my assessment for our mutual patient, Francisco Simmons. Thank you for allowing me the opportunity to care for your patient. Please do not hesitate to contact me should you have any other questions. Sincerely, Dr. Eldora Blanch  Otolaryngology Clinic Note Referring provider: Dr. Jordan HPI:  Francisco Simmons is a 74 y.o. male kindly referred by Dr. Jordan for evaluation of tonsil lesion  Initial visit (10/2024): Discussed the use of AI scribe software for clinical note transcription with the patient, who gave verbal consent to proceed.  History of Present Illness Francisco Simmons. is a 74 year old male referred for evaluation of a right tonsillar abnormality identified on PET scan during workup for a lung nodule.  He understands the abnormality involves the right tonsil. He has no throat pain, dysphagia, odynophagia, otalgia, neck masses, hemoptysis, or voice changes. He reports chronic throat dryness, without other oropharyngeal symptoms. His weight is stable  He takes 81 mg aspirin  daily and no other anticoagulants.   Patient otherwise denies: - dysphagia, odynophagia, unintentional weight loss - changes in voice, shortness of breath, hemoptysis - ear pain, neck masses .   ENT Surgery: no Personal or FHx of bleeding dz or anesthesia difficulty: no  AP/AC: ASA 81  Tobacco: quit 30 years ago, few cigarrettes/day (3-4 years); Alcohol: denies significant use  PMHx: HTN, HLD, PreDM  Independent Review of Additional Tests or Records:  PET CT 10/31/2024 reviewed and interpreted with respect to proximal airway and oropharynx: right tonsillar asymmetry with PET avid right tonsil lesion; small submental node and scattered LAD but no obvious PET avid lymphadenopathy in neck appreciated   PMH/Meds/All/SocHx/FamHx/ROS:   Past Medical History:  Diagnosis Date   BPH (benign prostatic hypertrophy)    CKD (chronic kidney disease), stage II    Complication of anesthesia     Hard to wake up after carpal tunnel surgery   GERD (gastroesophageal reflux disease)    HLD (hyperlipidemia)    Hypertension    Long term use of drug    Prediabetes    Primary localized osteoarthritis of left knee    Primary localized osteoarthritis of right knee 01/19/2021   Wears glasses      Past Surgical History:  Procedure Laterality Date   CARPAL TUNNEL RELEASE  2003   right   CARPAL TUNNEL RELEASE Left 04/15/2014   Procedure: LEFT CARPAL TUNNEL RELEASE;  Surgeon: Lamar Leonor Mickey LULLA, MD;  Location: [facility];  Service: Orthopedics;  Laterality: Left;   COLONOSCOPY     INGUINAL HERNIA REPAIR  1992   left   KNEE ARTHROSCOPY  1990   right and left   TOTAL KNEE ARTHROPLASTY Right 02/01/2021   Procedure: TOTAL KNEE ARTHROPLASTY;  Surgeon: Jane Lamar, MD;  Location: [facility];  Service: Orthopedics;  Laterality: Right;   TOTAL KNEE ARTHROPLASTY Left 07/26/2021   Procedure: TOTAL KNEE ARTHROPLASTY;  Surgeon: Jane Lamar, MD;  Location: [facility];  Service: Orthopedics;  Laterality: Left;    Family History  Problem Relation Age of Onset   Hypertension Mother    Lung cancer Father    Brain cancer Sister      Social Connections: Not on file     Current Medications[1]   Physical Exam:   BP (!) 151/91 (BP Location: Left Arm, Patient Position: Sitting, Cuff Size: Large)   Pulse 75   Ht 6' 2 (1.88 m)   Wt 216 lb (98 kg)   SpO2 93%   BMI  27.73 kg/m   Salient findings:  CN II-XII intact Bilateral EAC clear and TM intact with well pneumatized middle ear spaces Anterior rhinoscopy: Septum intact, relatively midline; bilateral inferior turbinates without significant hypertrophy No lesions of oral cavity/oropharynx EXCEPT - right tonsil asymmetric, but not firm --- clearly asymmetric compared to left; 2+/1; morphologically looks fairly normal No obviously palpable neck masses/lymphadenopathy/thyromegaly except small scattered lymph node submental and b/l  jugulodigastric area No respiratory distress or stridor; TFL was indicated to better evaluate the proximal airway, given the patient's history and exam findings, and is detailed below.  Seprately Identifiable Procedures:  Prior to initiating any procedures, risks/benefits/alternatives were explained to the patient and verbal consent obtained. Procedure Note Pre-procedure diagnosis: Oropharynx mass Post-procedure diagnosis: Same Procedure: Transnasal Fiberoptic Laryngoscopy, CPT 31575 - Mod 25 Indication: see above Complications: None apparent EBL: 0 mL  The procedure was undertaken to further evaluate the patient's complaint above, with mirror exam inadequate for appropriate examination due to gag reflex and poor patient tolerance  Procedure:  Patient was identified as correct patient. Verbal consent was obtained. The nose was sprayed with oxymetazoline and 4% lidocaine . The The flexible laryngoscope was passed through the nose to view the nasal cavity, pharynx (oropharynx, hypopharynx) and larynx.  The larynx was examined at rest and during multiple phonatory tasks. Documentation was obtained and reviewed with patient. The scope was removed. The patient tolerated the procedure well.  Findings: The nasal cavity and nasopharynx did not reveal any masses or lesions, mucosa appeared to be without obvious lesions. The tongue base, pharyngeal walls, piriform sinuses, vallecula, epiglottis and postcricoid region are normal in appearance EXCEPT: asymmetric right tonsil but mucosally looks normal. The visualized portion of the subglottis and proximal trachea is widely patent. The vocal folds are mobile bilaterally. There are no lesions on the free edge of the vocal folds nor elsewhere in the larynx worrisome for malignancy.    Electronically signed by: Eldora KATHEE Blanch, MD 11/13/2024 9:41 AM   Impression & Plans:  Francisco Simmons is a 74 y.o. male with:  1. Mass of oropharynx   2. Asymmetric tonsils     Right tonsil mass, PET avid. We discussed options: observation (would not rec), CT, Tonsil bx v/s tonsillectomy. We discussed that if the bx is neg, could still be carcinoma (small chance) and as such he opted for tonsillectomy. Discussed DDX including inflammatory lesion or carcinoma and he understands this. He is anxious to proceed with diagnosis  We discussed R/B/A for Tonsillectomy including significant post-op pain, bleeding (2%, including life threatening bleeding and requiring return to OR), and infections (still with pharyngitis) as well as persistent symptoms, lack of diagnosis, discovery of malignancy needing further treatment and risk of anesthesia. We also discussed post-op management and risks.   Will need to hold ASA 81 1 week prior to surgery     See below regarding exact medications prescribed this encounter including dosages and route: No orders of the defined types were placed in this encounter.     Thank you for allowing me the opportunity to care for your patient. Please do not hesitate to contact me should you have any other questions.  Sincerely, Eldora Blanch, MD Otolaryngologist (ENT), [facility] Phone: [phone] Fax: [phone]  11/13/2024, 9:41 AM   MDM:  [phone] Complexity/Problems addressed: mod - new problem, unknown diagnosis and prognosis Data complexity: mod - independent interpretation of PET/CT imaging - Morbidity: mod - dec for surgery  - Prescription Drug prescribed or managed:  n      [1]  Current Outpatient Medications:    amLODipine (NORVASC) 10 MG tablet, Take 10 mg by mouth daily., Disp: , Rfl:    aspirin  EC 81 MG tablet, Take 81 mg by mouth daily. Swallow whole., Disp: , Rfl:    atorvastatin (LIPITOR) 20 MG tablet, Take 20 mg by mouth at bedtime., Disp: , Rfl:    doxazosin (CARDURA) 2 MG tablet, Take 2 mg by mouth every evening., Disp: , Rfl:    losartan (COZAAR) 100 MG tablet, Take 100 mg by mouth daily., Disp: ,  Rfl:    metoprolol  tartrate (LOPRESSOR ) 25 MG tablet, Take 25 mg 2 hours before Coronary CT, Disp: 1 tablet, Rfl: 0   potassium chloride  (KLOR-CON  M) 10 MEQ tablet, Take 1 tablet (10 mEq total) by mouth daily., Disp: , Rfl:    Sod Picosulfate-Mag Ox-Cit Acd (CLENPIQ) 10-3.5-12 MG-GM -GM/175ML SOLN, 175 mLs., Disp: , Rfl:

## 2024-11-20 NOTE — Progress Notes (Signed)
 Surgical Instructions   Your procedure is scheduled on Wednesday, February 4th, 2026. Report to [facility] Main Entrance A at 1:30 P.M., then check in with the Admitting office. Any questions or running late day of surgery: call [phone]  Questions prior to your surgery date: call [phone], Monday-Friday, 8am-4pm. If you experience any cold or flu symptoms such as cough, fever, chills, shortness of breath, etc. between now and your scheduled surgery, please notify us  at the above number.     Remember:  Do not eat after midnight the night before your surgery   You may drink clear liquids until 12:30 P.M. the day of your surgery.   Clear liquids allowed are: Water , Non-Citrus Juices (without pulp), Carbonated Beverages, Clear Tea (no milk, honey, etc.), Black Coffee Only (NO MILK, CREAM OR POWDERED CREAMER of any kind), and Gatorade.    Take these medicines the morning of surgery with A SIP OF WATER : Amlodipine (Norvasc)   May take these medicines IF NEEDED: None.   Per your surgeon's instructions, Aspirin  should be held for 1 week prior to surgery.  Your last dose of Aspirin  should be on Tuesday, January 27th.     One week prior to surgery, STOP taking any Aleve, Naproxen, Ibuprofen, Motrin, Advil, Goody's, BC's, all herbal medications, fish oil, and non-prescription vitamins.                     Do NOT Smoke (Tobacco/Vaping) for 24 hours prior to your procedure.  If you use a CPAP at night, you may bring your mask/headgear for your overnight stay.   You will be asked to remove any contacts, glasses, piercing's, hearing aid's, dentures/partials prior to surgery. Please bring cases for these items if needed.    Your surgeon will determine if you are to be admitted or discharged the same day.  Patients discharged the day of surgery will not be allowed to drive home, and someone needs to stay with them for 24 hours.  SURGICAL WAITING ROOM VISITATION Patients may have  no more than 2 support people in the waiting area - these visitors may rotate.   Pre-op nurse will coordinate an appropriate time for 2 ADULT support persons, who may not rotate, to accompany patient in pre-op.  Children under the age of 32 must have an adult with them who is not the patient and must remain in the main waiting area with an adult.  If the patient needs to stay at the hospital during part of their recovery, the visitor guidelines for inpatient rooms apply.  Please refer to the [facility] website for the visitor guidelines for any additional information.   If you received a COVID test during your pre-op visit  it is requested that you wear a mask when out in public, stay away from anyone that may not be feeling well and notify your surgeon if you develop symptoms. If you have been in contact with anyone that has tested positive in the last 10 days please notify you surgeon.      Pre-operative CHG Bathing Instructions   You can play a key role in reducing the risk of infection after surgery. Your skin needs to be as free of germs as possible. You can reduce the number of germs on your skin by washing with CHG (chlorhexidine  gluconate) soap before surgery. CHG is an antiseptic soap that kills germs and continues to kill germs even after washing.   DO NOT use if you have an  allergy to chlorhexidine /CHG or antibacterial soaps. If your skin becomes reddened or irritated, stop using the CHG and notify one of our RNs at [phone].              TAKE A SHOWER THE NIGHT BEFORE SURGERY   Please keep in mind the following:  DO NOT shave, including legs and underarms, 48 hours prior to surgery.   You may shave your face before/day of surgery.  Place clean sheets on your bed the night before surgery Use a clean washcloth (not used since being washed) for shower. DO NOT sleep with pet's night before surgery.  CHG Shower Instructions:  Wash your face and private area with normal soap.  If you choose to wash your hair, wash first with your normal shampoo.  After you use shampoo/soap, rinse your hair and body thoroughly to remove shampoo/soap residue.  Turn the water  OFF and apply half the bottle of CHG soap to a CLEAN washcloth.  Apply CHG soap ONLY FROM YOUR NECK DOWN TO YOUR TOES (washing for 3-5 minutes)  DO NOT use CHG soap on face, private areas, open wounds, or sores.  Pay special attention to the area where your surgery is being performed.  If you are having back surgery, having someone wash your back for you may be helpful. Wait 2 minutes after CHG soap is applied, then you may rinse off the CHG soap.  Pat dry with a clean towel  Put on clean pajamas    Additional instructions for the day of surgery: If you choose, you may shower the morning of surgery with an antibacterial soap.  DO NOT APPLY any lotions, deodorants, cologne, or perfumes.   Do not wear jewelry or makeup Do not wear nail polish, gel polish, artificial nails, or any other type of covering on natural nails (fingers and toes) Do not bring valuables to the hospital. [facility] is not responsible for valuables/personal belongings. Put on clean/comfortable clothes.  Please brush your teeth.  Ask your nurse before applying any prescription medications to the skin.

## 2024-11-21 ENCOUNTER — Telehealth (INDEPENDENT_AMBULATORY_CARE_PROVIDER_SITE_OTHER): Payer: Self-pay | Admitting: Physician Assistant

## 2024-11-21 ENCOUNTER — Other Ambulatory Visit: Payer: Self-pay

## 2024-11-21 ENCOUNTER — Encounter (HOSPITAL_COMMUNITY)
Admission: RE | Admit: 2024-11-21 | Discharge: 2024-11-21 | Disposition: A | Discharge status: Home / Self Care | Source: Ambulatory Visit | Attending: Otolaryngology | Admitting: Otolaryngology

## 2024-11-21 ENCOUNTER — Encounter (HOSPITAL_COMMUNITY): Payer: Self-pay

## 2024-11-21 ENCOUNTER — Telehealth (INDEPENDENT_AMBULATORY_CARE_PROVIDER_SITE_OTHER): Payer: Self-pay | Admitting: Otolaryngology

## 2024-11-21 VITALS — BP 121/87 | HR 77 | Temp 98.4°F | Resp 17 | Ht 74.0 in | Wt 217.9 lb

## 2024-11-21 DIAGNOSIS — Z01818 Encounter for other preprocedural examination: Secondary | ICD-10-CM | POA: Insufficient documentation

## 2024-11-21 LAB — BASIC METABOLIC PANEL WITH GFR
Anion gap: 10 (ref 5–15)
BUN: 13 mg/dL (ref 8–23)
CO2: 28 mmol/L (ref 22–32)
Calcium: 8.6 mg/dL — ABNORMAL LOW (ref 8.9–10.3)
Chloride: 104 mmol/L (ref 98–111)
Creatinine, Ser: 1.26 mg/dL — ABNORMAL HIGH (ref 0.61–1.24)
GFR, Estimated: 60 mL/min — ABNORMAL LOW
Glucose, Bld: 95 mg/dL (ref 70–99)
Potassium: 2.8 mmol/L — ABNORMAL LOW (ref 3.5–5.1)
Sodium: 142 mmol/L (ref 135–145)

## 2024-11-21 LAB — CBC
HCT: 40 % (ref 39.0–52.0)
Hemoglobin: 13.4 g/dL (ref 13.0–17.0)
MCH: 29.8 pg (ref 26.0–34.0)
MCHC: 33.5 g/dL (ref 30.0–36.0)
MCV: 88.9 fL (ref 80.0–100.0)
Platelets: 256 10*3/uL (ref 150–400)
RBC: 4.5 MIL/uL (ref 4.22–5.81)
RDW: 13.4 % (ref 11.5–15.5)
WBC: 6.1 10*3/uL (ref 4.0–10.5)
nRBC: 0 % (ref 0.0–0.2)

## 2024-11-21 NOTE — Progress Notes (Signed)
 Francisco Simmons with Dr. Anthony office made aware of patient's potassium level of 2.8. Per Francisco patient was called and patient is going to reach out to PCP

## 2024-11-21 NOTE — Telephone Encounter (Signed)
 I received a message from preop testing today.  This is a patient of Dr. Anthony.  He had a potassium level of 2.8 which is down from 3.42 months ago, he does have a history of the same approximately 3 years ago with levels at 2.8, 2.9, 2.9.  He is currently on a potassium supplement.  He is also taking the diuretic.  He is asymptomatic presently.  I recommend that he immediately call his primary care provider as it is office hours and discuss a plan moving forward.  If he has any concerns or symptoms to go straight to the emergency room.  He verbalized understanding and agreement to today's plan.

## 2024-11-21 NOTE — Telephone Encounter (Signed)
 Delon from pre admission [facility] called stating pt's potassium was a 2.8. Requesting a call back at [phone]

## 2024-11-21 NOTE — Progress Notes (Addendum)
 PCP -  Maree Leni Edyth DELENA, MD  Cardiologist -   PPM/ICD - denies Device Orders - n/a Rep Notified - n/a  Chest x-ray - 05-31-24 EKG - 08-02-24 Stress Test -08-22-24  ECHO - 08-30-24 Cardiac Cath -   Sleep Study - denies CPAP - n/a  DM -denies  Blood Thinner Instructions: Aspirin  Instructions: HOLD 1 week prior. LAST DOSE 11-21-24  ERAS Protcol - Clear liquids until 12:30 PM  COVID TEST- n/a  Anesthesia review: Yes, hx of HTN BPH , HLD  Patient denies shortness of breath, fever, cough and chest pain at PAT appointment   All instructions explained to the patient, with a verbal understanding of the material. Patient agrees to go over the instructions while at home for a better understanding. Patient also instructed to self quarantine after being tested for COVID-19. The opportunity to ask questions was provided.

## 2024-11-22 NOTE — Progress Notes (Signed)
 Anesthesia Chart Review:  74 year old male with pertinent history including HTN, HLD, prediabetes, GERD, CKD 2, prolonged emergence.  Recently evaluated by cardiology at request of PCP for complaint of fatigue with exertion.  Exercise tolerance test 08/22/2024 showed good exercise tolerance, however, ST changes concerning for ischemia were noted.  Coronary CTA 09/16/2024 showed minimal calcium , nonobstructive CAD.  Incidental right lower lobe nodule was noted.  PET imaging 10/31/2024 showed right lower lobe solid pulmonary nodule measuring 1 cm without abnormal uptake.  Incidentally, an FDG avid right tonsillar lesion with corresponding soft tissue fullness was noted and felt suspicious for primary oropharyngeal squamous cell carcinoma.  ENT evaluation recommended.  Patient subsequently seen by Dr. Tobie who recommended tonsillectomy.  Preop labs reviewed, creatinine mildly elevated 1.26 consistent with history of CKD, moderate hypokalemia with potassium 2.8.  He does have a prior history of hypokalemia and is maintained on potassium supplement.  This result was called to surgeon's office.  Francisco Cohen, PA-C subsequently reached out to the patient and advised him to follow-up with PCP for further management.  Will recheck potassium with Istat on day of surgery.  EKG 08/02/2024: Sinus rhythm with first-degree AV block.  Rate 60.  Minimal voltage criteria for LVH, may be normal variant.  Coronary CTA 11/17/23: IMPRESSION: 1. Minimal calcium  noted in the LAD/RCA with score only 3.56, which is 11 th percentile for age/sex   2.  Dilated ascending thoracic aorta 3.8 cm   3.  CAD RADS 1 non obstructive CAD see description above  Exercise tolerance test 08/22/2024:    A Bruce protocol stress test was performed. Patient exercised for 7 min and 13 sec. Maximum HR of 125 bpm. MPHR 85.0%. Peak METS 8.8. The patient experienced no angina during the test. The test was stopped because the patient experienced  fatigue and dyspnea. The patient reported dyspnea during the stress test. Normal blood pressure response noted during stress.   1.0 mm of horizontal ST depression in the inferior leads (II, III, aVF, V5 and V6) was noted. ST depression began at minute 5:50 of stress and ended during recovery. ST deviation returned to baseline after 1-5 mins of recovery. There were no arrhythmias during stress. There were no arrhythmias during recovery. ECG was interpretable and conclusive. The ECG was positive for ischemia.   ECG rhythm shows normal sinus rhythm. Nonspecific ST changes in V4-V6. ECG demonstrates left anterior fascicular block. There is left ventricular hypertrophy without strain.     Francisco Simmons [facility] Short Stay Center/Anesthesiology Phone [phone] 11/25/2024 11:30 AM

## 2024-11-25 NOTE — Anesthesia Preprocedure Evaluation (Signed)
 "                                  Anesthesia Evaluation  Patient identified by MRN, date of birth, ID band Patient awake    Reviewed: Allergy & Precautions, NPO status , Patient's Chart, lab work & pertinent test results  History of Anesthesia Complications Negative for: history of anesthetic complications  Airway Mallampati: III  TM Distance: >3 FB Neck ROM: Full    Dental  (+) Teeth Intact, Dental Advisory Given   Pulmonary former smoker   breath sounds clear to auscultation       Cardiovascular hypertension, Pt. on medications  Rhythm:Regular Rate:Normal  Coronary CT 2025 IMPRESSION: 1. Minimal calcium  noted in the LAD/RCA with score only 3.56, which is 11 th percentile for age/sex   2.  Dilated ascending thoracic aorta 3.8 cm   3.  CAD RADS 1 non obstructive CAD see description above    Neuro/Psych negative neurological ROS  negative psych ROS   GI/Hepatic Neg liver ROS,GERD  ,,  Endo/Other  negative endocrine ROS    Renal/GU Renal InsufficiencyRenal disease  negative genitourinary   Musculoskeletal  (+) Arthritis ,    Abdominal   Peds  Hematology negative hematology ROS (+)   Anesthesia Other Findings   Reproductive/Obstetrics                              Anesthesia Physical Anesthesia Plan  ASA: 2  Anesthesia Plan: General   Post-op Pain Management: Tylenol  PO (pre-op)*   Induction: Intravenous  PONV Risk Score and Plan: 2 and Dexamethasone , Ondansetron  and Treatment may vary due to age or medical condition  Airway Management Planned: Oral ETT and Video Laryngoscope Planned  Additional Equipment:   Intra-op Plan:   Post-operative Plan: Extubation in OR  Informed Consent: I have reviewed the patients History and Physical, chart, labs and discussed the procedure including the risks, benefits and alternatives for the proposed anesthesia with the patient or authorized representative who has  indicated his/her understanding and acceptance.     Dental advisory given  Plan Discussed with: CRNA  Anesthesia Plan Comments: (PAT note by Lynwood Hope, PA-C: 74 year old male with pertinent history including HTN, HLD, prediabetes, GERD, CKD 2, prolonged emergence.  Recently evaluated by cardiology at request of PCP for complaint of fatigue with exertion.  Exercise tolerance test 08/22/2024 showed good exercise tolerance, however, ST changes concerning for ischemia were noted.  Coronary CTA 09/16/2024 showed minimal calcium , nonobstructive CAD.  Incidental right lower lobe nodule was noted.  PET imaging 10/31/2024 showed right lower lobe solid pulmonary nodule measuring 1 cm without abnormal uptake.  Incidentally, an FDG avid right tonsillar lesion with corresponding soft tissue fullness was noted and felt suspicious for primary oropharyngeal squamous cell carcinoma.  ENT evaluation recommended.  Patient subsequently seen by Dr. Tobie who recommended tonsillectomy.  Preop labs reviewed, creatinine mildly elevated 1.26 consistent with history of CKD, moderate hypokalemia with potassium 2.8.  He does have a prior history of hypokalemia and is maintained on potassium supplement.  This result was called to surgeon's office.  Reyes Cohen, PA-C subsequently reached out to the patient and advised him to follow-up with PCP for further management.  Will recheck potassium with Istat on day of surgery.  EKG 08/02/2024: Sinus rhythm with first-degree AV block.  Rate 60.  Minimal voltage criteria for  LVH, may be normal variant.  Coronary CTA 11/17/23: IMPRESSION: 1. Minimal calcium  noted in the LAD/RCA with score only 3.56, which is 11 th percentile for age/sex  2.  Dilated ascending thoracic aorta 3.8 cm  3.  CAD RADS 1 non obstructive CAD see description above  Exercise tolerance test 08/22/2024:    A Bruce protocol stress test was performed. Patient exercised for 7 min and 13 sec. Maximum HR  of 125 bpm. MPHR 85.0%. Peak METS 8.8. The patient experienced no angina during the test. The test was stopped because the patient experienced fatigue and dyspnea. The patient reported dyspnea during the stress test. Normal blood pressure response noted during stress.   1.0 mm of horizontal ST depression in the inferior leads (II, III, aVF, V5 and V6) was noted. ST depression began at minute 5:50 of stress and ended during recovery. ST deviation returned to baseline after 1-5 mins of recovery. There were no arrhythmias during stress. There were no arrhythmias during recovery. ECG was interpretable and conclusive. The ECG was positive for ischemia.   ECG rhythm shows normal sinus rhythm. Nonspecific ST changes in V4-V6. ECG demonstrates left anterior fascicular block. There is left ventricular hypertrophy without strain.  )         Anesthesia Quick Evaluation  "

## 2024-11-26 ENCOUNTER — Telehealth (INDEPENDENT_AMBULATORY_CARE_PROVIDER_SITE_OTHER): Payer: Self-pay

## 2024-11-26 NOTE — Telephone Encounter (Signed)
 Patient called regarding his potassium levels before surgery. I let him know to give the surgery center a call and I gave him their number. Patient understood. Patient says that he has been taking a double dose of potassium since he could not get advise from his PCP. Per Dr. Tobie I let him know to stop the potassium and that we would see him the day of surgery. He also asked if we would be checking his blood levels before surgery and I let him know that we do not deal with blood work. Patient understood. He also asked for advise on what to eat after surgery. I let him know that he could eat whatever he would like other than red jello or red foods and potato chips. Patient understood.

## 2024-11-27 ENCOUNTER — Encounter (HOSPITAL_COMMUNITY): Payer: Self-pay | Admitting: Physician Assistant

## 2024-11-27 ENCOUNTER — Other Ambulatory Visit: Payer: Self-pay

## 2024-11-27 ENCOUNTER — Encounter (HOSPITAL_COMMUNITY)
Admission: RE | Disposition: A | Discharge status: Home / Self Care | Payer: Self-pay | Source: Home / Self Care | Attending: Otolaryngology

## 2024-11-27 ENCOUNTER — Ambulatory Visit (HOSPITAL_COMMUNITY)
Admission: RE | Admit: 2024-11-27 | Discharge: 2024-11-27 | Disposition: A | Payer: Self-pay | Source: Home / Self Care | Attending: Otolaryngology | Admitting: Otolaryngology

## 2024-11-27 ENCOUNTER — Encounter (HOSPITAL_COMMUNITY): Payer: Self-pay | Admitting: Otolaryngology

## 2024-11-27 DIAGNOSIS — I10 Essential (primary) hypertension: Secondary | ICD-10-CM

## 2024-11-27 DIAGNOSIS — J359 Chronic disease of tonsils and adenoids, unspecified: Secondary | ICD-10-CM

## 2024-11-27 LAB — POCT I-STAT, CHEM 8
BUN: 11 mg/dL (ref 8–23)
Calcium, Ion: 1.07 mmol/L — ABNORMAL LOW (ref 1.15–1.40)
Chloride: 103 mmol/L (ref 98–111)
Creatinine, Ser: 1.3 mg/dL — ABNORMAL HIGH (ref 0.61–1.24)
Glucose, Bld: 101 mg/dL — ABNORMAL HIGH (ref 70–99)
HCT: 44 % (ref 39.0–52.0)
Hemoglobin: 15 g/dL (ref 13.0–17.0)
Potassium: 3 mmol/L — ABNORMAL LOW (ref 3.5–5.1)
Sodium: 142 mmol/L (ref 135–145)
TCO2: 24 mmol/L (ref 22–32)

## 2024-11-27 MED ORDER — POTASSIUM CHLORIDE 10 MEQ/100ML IV SOLN
INTRAVENOUS | Status: AC
  Filled 2024-11-27: qty 200

## 2024-11-27 MED ORDER — IBUPROFEN 200 MG PO TABS: 400.0000 mg | ORAL_TABLET | Freq: Four times a day (QID) | ORAL | 0 refills | Status: AC | PRN

## 2024-11-27 MED ORDER — LIDOCAINE 2% (20 MG/ML) 5 ML SYRINGE
INTRAMUSCULAR | Status: DC | PRN
  Administered 2024-11-27: 100 mg via INTRAVENOUS

## 2024-11-27 MED ORDER — ACETAMINOPHEN 500 MG PO TABS
1000.0000 mg | ORAL_TABLET | Freq: Once | ORAL | Status: AC
  Administered 2024-11-27: 1000 mg via ORAL
  Filled 2024-11-27: qty 2

## 2024-11-27 MED ORDER — PROPOFOL 10 MG/ML IV BOLUS
INTRAVENOUS | Status: DC | PRN
  Administered 2024-11-27 (×2): 50 mg via INTRAVENOUS
  Administered 2024-11-27: 100 mg via INTRAVENOUS

## 2024-11-27 MED ORDER — ROCURONIUM BROMIDE 10 MG/ML (PF) SYRINGE
PREFILLED_SYRINGE | INTRAVENOUS | Status: DC | PRN
  Administered 2024-11-27: 60 mg via INTRAVENOUS

## 2024-11-27 MED ORDER — FENTANYL CITRATE (PF) 250 MCG/5ML IJ SOLN
INTRAMUSCULAR | Status: DC | PRN
  Administered 2024-11-27: 50 ug via INTRAVENOUS

## 2024-11-27 MED ORDER — ORAL CARE MOUTH RINSE: 15.0000 mL | Freq: Once | OROMUCOSAL | Status: AC

## 2024-11-27 MED ORDER — CEFAZOLIN SODIUM-DEXTROSE 2-4 GM/100ML-% IV SOLN
INTRAVENOUS | Status: AC
  Filled 2024-11-27: qty 100

## 2024-11-27 MED ORDER — 0.9 % SODIUM CHLORIDE (POUR BTL) OPTIME
TOPICAL | Status: DC | PRN
  Administered 2024-11-27: 1000 mL

## 2024-11-27 MED ORDER — ACETAMINOPHEN 500 MG PO TABS: 1000.0000 mg | ORAL_TABLET | Freq: Four times a day (QID) | ORAL | 0 refills | Status: AC | PRN

## 2024-11-27 MED ORDER — SUGAMMADEX SODIUM 200 MG/2ML IV SOLN
INTRAVENOUS | Status: DC | PRN
  Administered 2024-11-27 (×2): 200 mg via INTRAVENOUS

## 2024-11-27 MED ORDER — CHLORHEXIDINE GLUCONATE 0.12 % MT SOLN
15.0000 mL | Freq: Once | OROMUCOSAL | Status: AC
  Administered 2024-11-27: 15 mL via OROMUCOSAL
  Filled 2024-11-27: qty 15

## 2024-11-27 MED ORDER — PHENYLEPHRINE 80 MCG/ML (10ML) SYRINGE FOR IV PUSH (FOR BLOOD PRESSURE SUPPORT)
PREFILLED_SYRINGE | INTRAVENOUS | Status: DC | PRN
  Administered 2024-11-27: 40 ug via INTRAVENOUS

## 2024-11-27 MED ORDER — ONDANSETRON HCL 4 MG/2ML IJ SOLN
INTRAMUSCULAR | Status: DC | PRN
  Administered 2024-11-27: 4 mg via INTRAVENOUS

## 2024-11-27 MED ORDER — FENTANYL CITRATE (PF) 100 MCG/2ML IJ SOLN
INTRAMUSCULAR | Status: AC
  Filled 2024-11-27: qty 2

## 2024-11-27 MED ORDER — LACTATED RINGERS IV SOLN: INTRAVENOUS | Status: DC

## 2024-11-27 MED ORDER — POTASSIUM CHLORIDE 10 MEQ/100ML IV SOLN
INTRAVENOUS | Status: DC | PRN
  Administered 2024-11-27: 10 meq via INTRAVENOUS

## 2024-11-27 MED ORDER — OXYCODONE HCL 5 MG PO TABS: 5.0000 mg | ORAL_TABLET | ORAL | 0 refills | Status: AC | PRN

## 2024-11-27 MED ORDER — DEXAMETHASONE SOD PHOSPHATE PF 10 MG/ML IJ SOLN
INTRAMUSCULAR | Status: DC | PRN
  Administered 2024-11-27: 10 mg via INTRAVENOUS

## 2024-11-27 NOTE — Op Note (Signed)
 Otolaryngology Operative note  Francisco Simmons. Date/Time of Admission: 11/27/2024  1:27 PM  CSN: [id number];MRN:[id number]  DOB: [birth year] Age: 74 y.o. Location: [facility] OR    Pre-Op Diagnosis: Right tonsil lesion  Post-Op Diagnosis: Same  Procedure: Tonsillectomy Age > 12 (Right) -- CPT [phone]  Surgeon: Eldora Blanch, MD  Anesthesia type:  General  Anesthesiologist: Anesthesiologist: Dene Lauraine DASEN, MD CRNA: Erlene Powell POUR, CRNA   Staff: Circulator: Henry Lauraine PARAS, RN Scrub Person: Tomas Krabbe, RN; Perez-Vasquez, Tiffany  Implants: None  Specimens: ID Type Source Tests Collected by Time Destination  1 : Right tonsil Tissue PATH ENT biopsy SURGICAL PATHOLOGY Blanch Eldora NOVAK, MD 11/27/2024 1645    EBL: <10cc Drains: None  Post-op disposition and condition: PACU, hemodynamically stable  Findings: Right tonsil with several tonsil stones, soft to palpation  Complications: None apparent  Indications and consent:  [name] is a 74 y.o. male with diagnoses above. The patient's options were discussed, including risks/benefits/alternatives for each option. Patient expressed understanding, and despite these risks, consented and decided to proceed with above procedures. Informed consent was signed before proceeding.  OPERATIVE DETAILS:  After being properly identified in the preoperative holding area, the patient was brought into the operating suite. Patient was placed supine on the operating table. A pre-procedural time-out was performed and general anesthesia was initiated.   The bed was then turned 90 degrees and the patient was prepped and draped in the usual fashion for tonsillectomy. Intraoperative steroids were administered only after the tonsil was removed. The mouth was held open in suspension using a Crowe-Davis mouth gag and Mayo stand. There was no evidence of submucosal cleft palate. The right tonsil was grasped using an Allis and removed in a  capsular plane using the protected spatula tip Bovie (15 cut, 15 coag). Small areas of bleeding and visible blood vessels were coagulated to achieve hemostasis.The operative field was then irrigated and meticulously checked for hemostasis. The Crowe-Davis was released and a flexible suction was used to remove stomach and oropharynx contents. The patient was resuspended and the tonsillar fossa was rechecked for hemostasis. There was no bleeding. The patient tolerated the procedure well.   With the surgical portion of the procedure complete, all instrumentation was then removed from the operative field. The patient's skin was cleaned. Patient was returned to the care of the anesthesia team. He was then weaned from his anesthetic and transported to the PACU in stable condition  COMPLICATIONS:  None apparent  SPECIMENS: ID Type Source Tests Collected by Time Destination  1 : Right tonsil Tissue PATH ENT biopsy SURGICAL PATHOLOGY Blanch Eldora NOVAK, MD 11/27/2024 1645      CONDITION: Stable, transferred to PACU.

## 2024-11-27 NOTE — Anesthesia Procedure Notes (Signed)
 Procedure Name: Intubation Date/Time: 11/27/2024 4:22 PM  Performed by: Erlene Powell POUR, CRNAPre-anesthesia Checklist: Patient identified, Emergency Drugs available, Suction available and Patient being monitored Patient Re-evaluated:Patient Re-evaluated prior to induction Oxygen Delivery Method: Circle system utilized Preoxygenation: Pre-oxygenation with 100% oxygen Induction Type: IV induction Ventilation: Mask ventilation without difficulty and Oral airway inserted - appropriate to patient size Laryngoscope Size: Glidescope and 4 Grade View: Grade I Tube type: Oral Tube size: 7.5 mm Number of attempts: 1 Airway Equipment and Method: Stylet Placement Confirmation: ETT inserted through vocal cords under direct vision, positive ETCO2 and breath sounds checked- equal and bilateral Secured at: 21 (teeth) cm Tube secured with: Tape Dental Injury: Teeth and Oropharynx as per pre-operative assessment

## 2024-11-27 NOTE — Transfer of Care (Signed)
 Immediate Anesthesia Transfer of Care Note  Patient: Francisco Simmons.  Procedure(s) Performed: TONSILLECTOMY (Right)  Patient Location: PACU  Anesthesia Type:General  Level of Consciousness: drowsy  Airway & Oxygen Therapy: Patient Spontanous Breathing and Patient connected to face mask oxygen  Post-op Assessment: Report given to RN and Post -op Vital signs reviewed and stable  Post vital signs: Reviewed and stable  Last Vitals:  Vitals Value Taken Time  BP 116/61 11/27/24 17:05  Temp 36.6 C 11/27/24 17:05  Pulse 63 11/27/24 17:10  Resp 19 11/27/24 17:10  SpO2 96 % 11/27/24 17:10  Vitals shown include unfiled device data.  Last Pain:  Vitals:   11/27/24 1705  TempSrc:   PainSc: Asleep         Complications: No notable events documented.

## 2024-11-27 NOTE — Discharge Instructions (Addendum)
 Tonsillectomy Discharge Instructions (Dr. Tobie) ENT Office Contact Info: Otolaryngology Nursing Triage (Monday-Friday daytime working hours or for emergencies after hours) [phone]  Effects of Anesthesia Tonsillectomy (with or without Adenoidectomy) involves a brief anesthesia, typically 20 - 60 minutes. Patients may be quite irritable for several hours after surgery. If sedatives were given, some patients will remain sleepy for much of the day. Nausea and vomiting is occasionally seen, and usually resolves by the evening of surgery - even without additional medications.  Medications Tonsillectomy is a painful procedure. Pain medications help but do not completely alleviate the discomfort.  For pain, ALTERNATE between Tylenol  and Motrin  and give a dose every 3 hours (i.e. Tylenol  given at 12pm, then Motrin  at 3pm then Tylenol  at 6pm). You do not need to wait for pain to give them medication, scheduled dosing of medications will control the pain more effectively. Please take 1000 mg of Tylenol  and 400 mg of Ibuprofen  every 6 hours. For children over 75 years of age or adults, we will also prescribe a narcotic pain medication - take 5 mg of oxycodone  every 4 hours as needed. Please do not mix with any other narcotic medications or drive while taking narcotics. For adults, do not take more than 4000mg  Tylenol  over a 24 hour period. If you have liver disease, this amount should be less than 2000mg   Activity  Vigorous exercise should be avoided for 14 days after surgery. Baths and showers are fine. Many patients have reduced energy levels until their pain decrease.  You should not travel out of the local area for a full 2 weeks after surgery in case you experience bleeding after surgery.   Eating & Drinking Dehydration is the biggest enemy in the recovery period. It will increase the pain, increase the risk of bleeding and delay the healing. It usually happens because the pain of swallowing keeps the  patient from drinking enough liquids. The only drinks to avoid are citrus like orange and grapefruit juices because they will burn the back of the throat. Although drinking is more important, eating is fine even the day of surgery but avoid foods that are crunchy or have sharp edges for 10 days. Dairy products may be taken, if desired. You should avoid acidic, salty and spicy foods (especially tomato sauces). Almost everyone loses some weight after tonsillectomy (which is usually regained in the 2nd or 3rd week after surgery).  Drinking is far more important that eating in the first 14 days after surgery, so concentrate on that first and foremost. Adequate liquid intake probably speeds recovery.  Other things  If the tonsils and adenoids are very large, the patient's voice may change after surgery.  The recovery from tonsillectomy is a very painful period, often the worst pain people can recall, so please be understanding and patient with yourself, or the patient you are caring for. It is helpful to take pain medicine during the night if the patient awakens-- the worst pain is usually in the morning. The pain may seem to increase 2-5 days after surgery -this is normal when inflammation sets in. Please be aware that no combination of medicines will eliminate the pain - the patient will need to continue eating/drinking in spite of the remaining discomfort.  What should we expect after surgery? As previously mentioned, most patients have a significant amount of pain after tonsillectomy, with pain resolving 7-14 days after surgery. Older children and adults seem to have more discomfort. Most patients can go home the day of  surgery.  Ear pain: Many people will complain of earaches after tonsillectomy. This is normal and should resolve. Give pain medications and encourage liquid intake.  Fever: Many patients have a low-grade fever after tonsillectomy - up to 101.5 degrees (380 C.) for several days. Higher  prolonged fever should be reported to your surgeon.  Bad looking (and bad smelling) throat: After surgery, the place where the tonsils were removed is covered with a white film, which is a moist scab. This usually develops 3-5 days after surgery and falls off 10-14 days after surgery and usually causes bad breath. There will be some redness and swelling as well. The uvula (the part of the throat that hangs down in the middle between the tonsils) is usually swollen for several days after surgery.  Sore/bruised feeling of Tongue: This is common for the first few days after surgery because the tongue is pushed out of the way to take out the tonsils in surgery.  When should we call the doctor?  Nausea/Vomiting: This is a common side effect from General Anesthesia and can last up to 24-36 hours after surgery. Try giving sips of clear liquids like Sprite, water  or apple juice then gradually increase fluid intake. If the nausea or vomiting continues beyond this time frame, call the doctor's office for medications that will help relieve the nausea and vomiting.  Bleeding: Significant bleeding is rare, but it happens to about 5% of patients who have tonsillectomy. It may come from the nose, the mouth, or be vomited or coughed up. Ice water  mouthwashes may help stop or reduce bleeding. Please call our office or go to the ED for any bleeding from nose or mouth.  Dehydration: If there has been little or no liquids intake for 24 hours, the patient may need to come to the hospital for IV fluids. Signs of dehydration include lethargy, the lack of tears when crying, and reduced or very concentrated urine output.  High Fever: If the patient has a consistent temperatures greater than 102, or when accompanied by cough or difficulty breathing, you should call the doctor's office.  Resume your aspirin  81mg  on Thursday February 12th

## 2024-11-27 NOTE — H&P (Signed)
 Pre-Operative H&P - Day Of Surgery Patient Name: Francisco Simmons. Date:   11/27/2024  HPI: Francisco Simmons is a 74 y.o. male who presents today for operative treatment of right tonsil PET avid lesion. Patient denies recent significant changes to health or significant new medications or physiologic change in condition which would immediately impact plans. No new types of therapy has been initiated that would change the plan or the appropriateness of the plan.   ROS:  A complete review of systems was obtained and is otherwise negative.   PMH:  Past Medical History:  Diagnosis Date   BPH (benign prostatic hypertrophy)    CKD (chronic kidney disease), stage II    Complication of anesthesia    Hard to wake up after carpal tunnel surgery   GERD (gastroesophageal reflux disease)    HLD (hyperlipidemia)    Hypertension    Long term use of drug    Prediabetes    Primary localized osteoarthritis of left knee    Primary localized osteoarthritis of right knee 01/19/2021   Wears glasses     PSH:  Past Surgical History:  Procedure Laterality Date   CARPAL TUNNEL RELEASE  2003   right   CARPAL TUNNEL RELEASE Left 04/15/2014   Procedure: LEFT CARPAL TUNNEL RELEASE;  Surgeon: Lamar Leonor Mickey LULLA, MD;  Location: [facility];  Service: Orthopedics;  Laterality: Left;   COLONOSCOPY     INGUINAL HERNIA REPAIR  1992   left   KNEE ARTHROSCOPY  1990   right and left   TOTAL KNEE ARTHROPLASTY Right 02/01/2021   Procedure: TOTAL KNEE ARTHROPLASTY;  Surgeon: Jane Lamar, MD;  Location: [facility];  Service: Orthopedics;  Laterality: Right;   TOTAL KNEE ARTHROPLASTY Left 07/26/2021   Procedure: TOTAL KNEE ARTHROPLASTY;  Surgeon: Jane Lamar, MD;  Location: [facility];  Service: Orthopedics;  Laterality: Left;    MEDS:  Current Medications[1]  ALLERGIES: Patient has no known allergies.  EXAM: Vitals: BP (!) 146/94   Pulse (!) 59   Temp 97.6 F (36.4 C) (Oral)   Resp 18   SpO2 98%    General Awake, at baseline alertness.   HEENT No scleral icterus or conjunctival hemorrhage. Globe position appears normal. External ears  normal. Nose patent without rhinorrhea. No lymphadenopathy. No thyromegaly  Cardiovascular No cyanosis.  Pulmonary No audible stridor. Breathing easily with no labor.  Neuro Symmetric facial movement.   Psychiatry Appropriate affect and mood.  Skin No scars or lesions on face or neck.  Extermities Moves all extremities with normal range of motion.   Other Findings None.   Assessment & Plan: Francisco Simmons has diagnoses of right tonsil PET avid lesion and will go to the OR today for right tonsillectomy, other indicated procedures. Informed consent was obtained and available in EMR today. All questions have been answered, and risks/benefits/alternatives of procedure as noted in the consent were discussed in a quiet area. Questions were invited and answered. The patient expressed understanding, provided consent and wished to proceed despite risks.  Francisco Simmons 11/27/2024 3:07 PM     [1]  Current Facility-Administered Medications:    lactated ringers  infusion, , Intravenous, Continuous, Corinne Garnette BRAVO, MD, Last Rate: 10 mL/hr at 11/27/24 1410, New Bag at 11/27/24 1410

## 2024-11-27 NOTE — Telephone Encounter (Signed)
 Provider informed.

## 2024-11-28 ENCOUNTER — Encounter (HOSPITAL_COMMUNITY): Payer: Self-pay | Admitting: Otolaryngology

## 2024-11-28 NOTE — Anesthesia Postprocedure Evaluation (Signed)
"   Anesthesia Post Note  Patient: Francisco Simmons.  Procedure(s) Performed: TONSILLECTOMY (Right: Throat)     Patient location during evaluation: PACU Anesthesia Type: General Level of consciousness: awake and alert Pain management: pain level controlled Vital Signs Assessment: post-procedure vital signs reviewed and stable Respiratory status: spontaneous breathing Cardiovascular status: stable Postop Assessment: no apparent nausea or vomiting Anesthetic complications: no   No notable events documented.  Last Vitals:  Vitals:   11/27/24 1715 11/27/24 1730  BP: 100/65 107/65  Pulse: 60 61  Resp: 14 18  Temp:  36.6 C  SpO2: 97% 97%    Last Pain:  Vitals:   11/27/24 1715  TempSrc:   PainSc: 0-No pain                 Arlin Sass T Colhoun      "

## 2024-11-29 ENCOUNTER — Ambulatory Visit (INDEPENDENT_AMBULATORY_CARE_PROVIDER_SITE_OTHER): Payer: Self-pay

## 2024-11-29 LAB — SURGICAL PATHOLOGY

## 2024-12-12 ENCOUNTER — Encounter (INDEPENDENT_AMBULATORY_CARE_PROVIDER_SITE_OTHER): Admitting: Otolaryngology
